# Patient Record
Sex: Female | Born: 1990 | Race: Black or African American | Hispanic: No | Marital: Single | Smoking: Never smoker
Health system: Southern US, Community
[De-identification: ages and names within clinical notes are randomized; demographics above are authoritative.]

## PROBLEM LIST (undated history)

## (undated) HISTORY — PX: NO PAST SURGERIES: SHX2092

---
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
## 2010-07-04 LAB — CBC
HCT: 29.4 % — ABNORMAL LOW (ref 36.0–46.0)
Hemoglobin: 10.2 g/dL — ABNORMAL LOW (ref 12.0–15.0)
MCHC: 32.4 g/dL (ref 30.0–36.0)
MCHC: 33.1 g/dL (ref 30.0–36.0)
MCV: 80.7 fL (ref 78.0–100.0)
MCV: 80.9 fL (ref 78.0–100.0)
Platelets: 182 10*3/uL (ref 150–400)
RBC: 3.89 MIL/uL (ref 3.87–5.11)
RDW: 14.6 % (ref 11.5–15.5)
RDW: 14.6 % (ref 11.5–15.5)
WBC: 14.9 10*3/uL — ABNORMAL HIGH (ref 4.0–10.5)

## 2010-12-23 LAB — URINALYSIS, ROUTINE W REFLEX MICROSCOPIC
Bilirubin Urine: NEGATIVE
Ketones, ur: 40 mg/dL — AB
Nitrite: NEGATIVE
Protein, ur: NEGATIVE mg/dL
Urobilinogen, UA: 4 mg/dL — ABNORMAL HIGH (ref 0.0–1.0)
pH: 6.5 (ref 5.0–8.0)

## 2013-05-14 ENCOUNTER — Encounter (HOSPITAL_COMMUNITY): Payer: Self-pay | Admitting: Emergency Medicine

## 2013-05-14 ENCOUNTER — Emergency Department (HOSPITAL_COMMUNITY)
Admission: EM | Admit: 2013-05-14 | Discharge: 2013-05-14 | Disposition: A | Discharge status: Home / Self Care | Payer: Self-pay | Attending: Emergency Medicine | Admitting: Emergency Medicine

## 2013-05-14 DIAGNOSIS — Z79899 Other long term (current) drug therapy: Secondary | ICD-10-CM | POA: Insufficient documentation

## 2013-05-14 DIAGNOSIS — R51 Headache: Secondary | ICD-10-CM | POA: Insufficient documentation

## 2013-05-14 DIAGNOSIS — J020 Streptococcal pharyngitis: Secondary | ICD-10-CM | POA: Insufficient documentation

## 2013-05-14 DIAGNOSIS — R61 Generalized hyperhidrosis: Secondary | ICD-10-CM | POA: Insufficient documentation

## 2013-05-14 MED ORDER — ACETAMINOPHEN 325 MG PO TABS
325.0000 mg | ORAL_TABLET | Freq: Once | ORAL | Status: AC
  Administered 2013-05-14: 325 mg via ORAL
  Filled 2013-05-14: qty 1

## 2013-05-14 MED ORDER — ACETAMINOPHEN 325 MG PO TABS
650.0000 mg | ORAL_TABLET | Freq: Once | ORAL | Status: AC
  Administered 2013-05-14: 650 mg via ORAL
  Filled 2013-05-14: qty 2

## 2013-05-14 MED ORDER — SODIUM CHLORIDE 0.9 % IV BOLUS (SEPSIS): 1000.0000 mL | Freq: Once | INTRAVENOUS | Status: DC

## 2013-05-14 MED ORDER — AMOXICILLIN 500 MG PO CAPS: 500.0000 mg | ORAL_CAPSULE | Freq: Three times a day (TID) | ORAL | Status: DC

## 2013-05-14 MED ORDER — ACETAMINOPHEN 325 MG PO TABS
650.0000 mg | ORAL_TABLET | Freq: Once | ORAL | Status: DC
  Filled 2013-05-14: qty 2

## 2013-05-14 MED ORDER — AMOXICILLIN 500 MG PO CAPS
500.0000 mg | ORAL_CAPSULE | Freq: Once | ORAL | Status: AC
  Administered 2013-05-14: 500 mg via ORAL
  Filled 2013-05-14: qty 1

## 2013-05-14 NOTE — ED Notes (Signed)
RN dropped tylenol on floor, new order placed. [facility]

## 2013-05-14 NOTE — ED Notes (Signed)
Patient presents with c/o generalized body aches, nausea for the past 2 days.  Has been taking Tylenol and Nyquil

## 2013-05-14 NOTE — Discharge Instructions (Signed)
Please continue the antibiotics for possible strep throat as instructed. Drink plenty of fluids to stay hydrated. Take Tylenol and/or ibuprofen for pain and fever. Followup with a primary care provider.     Strep Throat Strep throat is an infection of the throat caused by a bacteria named Streptococcus pyogenes. Your caregiver may call the infection streptococcal "tonsillitis" or "pharyngitis" depending on whether there are signs of inflammation in the tonsils or back of the throat. Strep throat is most common in children aged 5 15 years during the cold months of the year, but it can occur in people of any age during any season. This infection is spread from person to person (contagious) through coughing, sneezing, or other close contact. SYMPTOMS   Fever or chills.  Painful, swollen, red tonsils or throat.  Pain or difficulty when swallowing.  White or yellow spots on the tonsils or throat.  Swollen, tender lymph nodes or "glands" of the neck or under the jaw.  Red rash all over the body (rare). DIAGNOSIS  Many different infections can cause the same symptoms. A test must be done to confirm the diagnosis so the right treatment can be given. A "rapid strep test" can help your caregiver make the diagnosis in a few minutes. If this test is not available, a light swab of the infected area can be used for a throat culture test. If a throat culture test is done, results are usually available in a day or two. TREATMENT  Strep throat is treated with antibiotic medicine. HOME CARE INSTRUCTIONS   Gargle with 1 tsp of salt in 1 cup of warm water, 3 4 times per day or as needed for comfort.  Family members who also have a sore throat or fever should be tested for strep throat and treated with antibiotics if they have the strep infection.  Make sure everyone in your household washes their hands well.  Do not share food, drinking cups, or personal items that could cause the infection to spread to  others.  You may need to eat a soft food diet until your sore throat gets better.  Drink enough water and fluids to keep your urine clear or pale yellow. This will help prevent dehydration.  Get plenty of rest.  Stay home from school, daycare, or work until you have been on antibiotics for 24 hours.  Only take over-the-counter or prescription medicines for pain, discomfort, or fever as directed by your caregiver.  If antibiotics are prescribed, take them as directed. Finish them even if you start to feel better. SEEK MEDICAL CARE IF:   The glands in your neck continue to enlarge.  You develop a rash, cough, or earache.  You cough up green, yellow-brown, or bloody sputum.  You have pain or discomfort not controlled by medicines.  Your problems seem to be getting worse rather than better. SEEK IMMEDIATE MEDICAL CARE IF:   You develop any new symptoms such as vomiting, severe headache, stiff or painful neck, chest pain, shortness of breath, or trouble swallowing.  You develop severe throat pain, drooling, or changes in your voice.  You develop swelling of the neck, or the skin on the neck becomes red and tender.  You have a fever.  You develop signs of dehydration, such as fatigue, dry mouth, and decreased urination.  You become increasingly sleepy, or you cannot wake up completely. Document Released: 03/03/2000 Document Revised: 02/21/2012 Document Reviewed: 05/05/2010 [facility] Patient Information 2014 [place], [place].

## 2013-05-14 NOTE — ED Provider Notes (Signed)
CSN: [id number]     Arrival date & time 05/14/13  1846 History   First MD Initiated Contact with Patient 05/14/13 2019     Chief Complaint  Patient presents with  . Generalized Body Aches   HPI  History provided by the patient. Patient is a 23 year old female with no significant PMH who presents with complaints of generalized bodyaches and began on Monday. She reports having a sore throat the same day which has been persistent. She has been taking some Tylenol and NyQuil for symptoms without any significant changes. She reports pain anytime she eats and drinks but has been drinking fluids. Denies any other associated symptoms. Denies any vomiting, diarrhea, cough or congestion. Denies any known sick contacts. No recent travel.    History reviewed. No pertinent past medical history. History reviewed. No pertinent past surgical history. History reviewed. No pertinent family history. History  Substance Use Topics  . Smoking status: Never Smoker   . Smokeless tobacco: Never Used  . Alcohol Use: Yes     Comment: ocassionally   OB History   Grav Para Term Preterm Abortions TAB SAB Ect Mult Living                 Review of Systems  Constitutional: Positive for fever, chills and diaphoresis.  HENT: Positive for sore throat. Negative for congestion and rhinorrhea.   Respiratory: Negative for cough.   Gastrointestinal: Negative for nausea, vomiting, abdominal pain and diarrhea.  Genitourinary: Negative for dysuria, frequency, hematuria and flank pain.  Musculoskeletal: Positive for myalgias.  Neurological: Positive for headaches.  All other systems reviewed and are negative.      Allergies  Review of patient's allergies indicates no known allergies.  Home Medications   Current Outpatient Rx  Name  Route  Sig  Dispense  Refill  . acetaminophen (TYLENOL) 500 MG tablet   Oral   Take 1,000 mg by mouth every 8 (eight) hours as needed for moderate pain.         Marland Kitchen. ibuprofen  (ADVIL,MOTRIN) 200 MG tablet   Oral   Take 400 mg by mouth daily as needed for mild pain.          BP 107/69  Pulse 94  Temp(Src) 100.9 F (38.3 C) (Oral)  Resp 18  SpO2 99% Physical Exam  Nursing note and vitals reviewed. Constitutional: She is oriented to person, place, and time. She appears well-developed and well-nourished. No distress.  HENT:  Head: Normocephalic.  Right Ear: Tympanic membrane normal.  Left Ear: Tympanic membrane normal.  Pharynx is erythematous with mild enlargement of the tonsils bilaterally. There is exudate bilaterally. Uvula midline. No central PTA. No trismus.  Neck: Normal range of motion. Neck supple.  No meningeal signs  Cardiovascular: Normal rate and regular rhythm.   Pulmonary/Chest: Effort normal and breath sounds normal. No respiratory distress. She has no wheezes. She has no rales.  Abdominal: Soft.  Musculoskeletal: Normal range of motion.  Lymphadenopathy:    She has cervical adenopathy.  Neurological: She is alert and oriented to person, place, and time.  Skin: Skin is warm and dry. No rash noted.  Psychiatric: She has a normal mood and affect. Her behavior is normal.    ED Course  Procedures   DIAGNOSTIC STUDIES: Oxygen Saturation is 100% on room air.    COORDINATION OF CARE:  Nursing notes reviewed. Vital signs reviewed. Initial pt interview and examination performed.   9:26 PM-patient seen and evaluated. She appears well. She does  not appear severely ill or toxic.  Patient does have significantly erythematous pharynx and tonsil area with exudate. No signs for peritonsillar abscess. Symptoms to suggest strep throat or possibly viral infection. Discussed treatment options with patient and at this time we'll give prescription for amoxicillin for possible strep throat. She was instructed on symptomatic treatment for her sore throat and fevers. He agrees with plan.   Treatment plan initiated: Medications  acetaminophen (TYLENOL)  tablet 650 mg (650 mg Oral Given 05/14/13 2039)  acetaminophen (TYLENOL) tablet 325 mg (325 mg Oral Given 05/14/13 2048)  amoxicillin (AMOXIL) capsule 500 mg (500 mg Oral Given 05/14/13 2123)      MDM   Final diagnoses:  Strep throat        Angus Seller, PA-C 05/14/13 2128

## 2013-05-15 NOTE — ED Provider Notes (Signed)
Medical screening examination/treatment/procedure(s) were performed by non-physician practitioner and as supervising physician I was immediately available for consultation/collaboration.  EKG Interpretation   None        Arnitra Sokoloski, MD 05/15/13 1617 

## 2015-03-19 ENCOUNTER — Encounter (HOSPITAL_COMMUNITY): Payer: Self-pay | Admitting: *Deleted

## 2015-03-19 ENCOUNTER — Emergency Department (HOSPITAL_COMMUNITY)
Admission: EM | Admit: 2015-03-19 | Discharge: 2015-03-19 | Disposition: A | Discharge status: Home / Self Care | Payer: Self-pay | Attending: Emergency Medicine | Admitting: Emergency Medicine

## 2015-03-19 DIAGNOSIS — R69 Illness, unspecified: Secondary | ICD-10-CM

## 2015-03-19 DIAGNOSIS — J111 Influenza due to unidentified influenza virus with other respiratory manifestations: Secondary | ICD-10-CM | POA: Insufficient documentation

## 2015-03-19 DIAGNOSIS — R1110 Vomiting, unspecified: Secondary | ICD-10-CM | POA: Insufficient documentation

## 2015-03-19 DIAGNOSIS — Z792 Long term (current) use of antibiotics: Secondary | ICD-10-CM | POA: Insufficient documentation

## 2015-03-19 DIAGNOSIS — H9209 Otalgia, unspecified ear: Secondary | ICD-10-CM | POA: Insufficient documentation

## 2015-03-19 MED ORDER — IBUPROFEN 800 MG PO TABS: 800.0000 mg | ORAL_TABLET | Freq: Three times a day (TID) | ORAL | Status: DC | PRN

## 2015-03-19 MED ORDER — HYDROCODONE-HOMATROPINE 5-1.5 MG/5ML PO SYRP: 5.0000 mL | ORAL_SOLUTION | Freq: Four times a day (QID) | ORAL | Status: DC | PRN

## 2015-03-19 NOTE — ED Notes (Addendum)
Pt reports flu like symptoms x 3 days. Has chills, headache, face pain, productive cough, bodyaches and lack of appetite. Mask on pt at triage.

## 2015-03-19 NOTE — ED Provider Notes (Signed)
CSN: [id number]     Arrival date & time 03/19/15  1148 History  By signing my name below, I, Ronney Lion, attest that this documentation has been prepared under the direction and in the presence of [facility], PA-C. Electronically Signed: Ronney Lion, ED Scribe. 03/19/2015. 1:45 PM.    Chief Complaint  Patient presents with  . Influenza   The history is provided by the patient. No language interpreter was used.   HPI Comments: Beverly Chapman is a 24 y.o. female with no pertinent PMHx, who presents to the Emergency Department with multiple complaints, including gradual-onset, constant, moderate, generalized myalgias, that began 4 days ago. Associated symptoms include chills, a cough that is occasionally productive, one episode of post-tussive vomiting, bilateral otalgia, sore throat, or rhinorrhea. She states she is unsure whether she had a fever, as she did not take her temperature. Patient had taken Tylenol yesterday with moderate but transient relief. Patient states she did not get a flu vaccine this year. She denies any known sick contact. She denies a history of asthma or smoking. She denies hemoptysis, abdominal pain, diarrhea, or dysuria.    History reviewed. No pertinent past medical history. History reviewed. No pertinent past surgical history. History reviewed. No pertinent family history. Social History  Substance Use Topics  . Smoking status: Never Smoker   . Smokeless tobacco: Never Used  . Alcohol Use: Yes     Comment: ocassionally   OB History    No data available     Review of Systems  Constitutional: Positive for chills and appetite change.  HENT: Positive for ear pain, rhinorrhea and sore throat.   Respiratory: Positive for cough.   Gastrointestinal: Positive for vomiting. Negative for abdominal pain and diarrhea.  Genitourinary: Negative for dysuria.  Musculoskeletal: Positive for myalgias (generalized).  Neurological: Positive for headaches.  All other systems  reviewed and are negative.  Allergies  Review of patient's allergies indicates no known allergies.  Home Medications   Prior to Admission medications   Medication Sig Start Date End Date Taking? Authorizing Provider  acetaminophen (TYLENOL) 500 MG tablet Take 1,000 mg by mouth every 8 (eight) hours as needed for moderate pain.    Historical Provider, MD  amoxicillin (AMOXIL) 500 MG capsule Take 1 capsule (500 mg total) by mouth 3 (three) times daily. 05/14/13   Ivonne Andrew, PA-C  ibuprofen (ADVIL,MOTRIN) 200 MG tablet Take 400 mg by mouth daily as needed for mild pain.    Historical Provider, MD   BP 119/87 mmHg  Pulse 100  Temp(Src) 99.7 F (37.6 C) (Oral)  Resp 18  SpO2 96%  LMP 03/12/2015 Physical Exam  Constitutional: She appears well-developed and well-nourished. No distress.  HENT:  Head: Normocephalic and atraumatic.  Mouth/Throat: Oropharynx is clear and moist. No oropharyngeal exudate.  Eyes: Conjunctivae are normal.  Neck: Neck supple.  Cardiovascular: Normal rate and regular rhythm.   Pulmonary/Chest: Effort normal and breath sounds normal. No respiratory distress. She has no wheezes. She has no rales.  Lungs are clear to auscultation.   Neurological: She is alert.  Skin: She is not diaphoretic.  Nursing note and vitals reviewed.   ED Course  Procedures (including critical care time)  DIAGNOSTIC STUDIES: Oxygen Saturation is 96% on RA, normal by my interpretation.    COORDINATION OF CARE: 1:36 PM - Patients symptoms are consistent with URI, likely viral etiology. Discussed that antibiotics are not indicated for viral infections. Pt will be discharged with symptomatic treatment, including ibuprofen and  Hycodan. Pt verbalizes understanding and is agreeable with plan.  MDM   Final diagnoses:  Influenza-like illness   Afebrile, nontoxic patient with constellation of symptoms suggestive of viral syndrome, possible influenza.  No concerning findings on exam.   Discharged home with supportive care, PCP follow up.  Discussed result, findings, treatment, and follow up  with patient.  Pt given return precautions.  Pt verbalizes understanding and agrees with plan.      I doubt any other EMC precluding discharge at this time including, but not necessarily limited to the following: PE, PNA  I personally performed the services described in this documentation, which was scribed in my presence. The recorded information has been reviewed and is accurate.     Trixie Dredgemily Jarrick Fjeld, PA-C 03/19/15 1948  Glynn OctaveStephen Rancour, MD 03/19/15 [phone]

## 2015-03-19 NOTE — Discharge Instructions (Signed)
Read the information below.  Use the prescribed medication as directed.  Please discuss all new medications with your pharmacist.  You may return to the Emergency Department at any time for worsening condition or any new symptoms that concern you.  If you develop high fevers that do not resolve with tylenol or ibuprofen, you have difficulty swallowing or breathing, or you are unable to tolerate fluids by mouth, return to the ER for a recheck.      Influenza, Adult Influenza (flu) is an infection in the mouth, nose, and throat (respiratory tract) caused by a virus. The flu can make you feel very ill. Influenza spreads easily from person to person (contagious).  HOME CARE   Only take medicines as told by your doctor.  Use a cool mist humidifier to make breathing easier.  Get plenty of rest until your fever goes away. This usually takes 3 to 4 days.  Drink enough fluids to keep your pee (urine) clear or pale yellow.  Cover your mouth and nose when you cough or sneeze.  Wash your hands well to avoid spreading the flu.  Stay home from work or school until your fever has been gone for at least 1 full day.  Get a flu shot every year. GET HELP RIGHT AWAY IF:   You have trouble breathing or feel short of breath.  Your skin or nails turn blue.  You have severe neck pain or stiffness.  You have a severe headache, facial pain, or earache.  Your fever gets worse or keeps coming back.  You feel sick to your stomach (nauseous), throw up (vomit), or have watery poop (diarrhea).  You have chest pain.  You have a deep cough that gets worse, or you cough up more thick spit (mucus). MAKE SURE YOU:   Understand these instructions.  Will watch your condition.  Will get help right away if you are not doing well or get worse.   This information is not intended to replace advice given to you by your health care provider. Make sure you discuss any questions you have with your health care  provider.   Document Released: 12/14/2007 Document Revised: 03/27/2014 Document Reviewed: 06/05/2011 Elsevier Interactive Patient Education 2016 Elsevier Inc.  Viral Infections A virus is a type of germ. Viruses can cause:  Minor sore throats.  Aches and pains.  Headaches.  Runny nose.  Rashes.  Watery eyes.  Tiredness.  Coughs.  Loss of appetite.  Feeling sick to your stomach (nausea).  Throwing up (vomiting).  Watery poop (diarrhea). HOME CARE   Only take medicines as told by your doctor.  Drink enough water and fluids to keep your pee (urine) clear or pale yellow. Sports drinks are a good choice.  Get plenty of rest and eat healthy. Soups and broths with crackers or rice are fine. GET HELP RIGHT AWAY IF:   You have a very bad headache.  You have shortness of breath.  You have chest pain or neck pain.  You have an unusual rash.  You cannot stop throwing up.  You have watery poop that does not stop.  You cannot keep fluids down.  You or your child has a temperature by mouth above 102 F (38.9 C), not controlled by medicine.  Your baby is older than 3 months with a rectal temperature of 102 F (38.9 C) or higher.  Your baby is 18 months old or younger with a rectal temperature of 100.4 F (38 C) or higher. MAKE  SURE YOU:   Understand these instructions.  Will watch this condition.  Will get help right away if you are not doing well or get worse.   This information is not intended to replace advice given to you by your health care provider. Make sure you discuss any questions you have with your health care provider.   Document Released: 02/17/2008 Document Revised: 05/29/2011 Document Reviewed: 08/12/2014 Elsevier Interactive Patient Education [employer] Elsevier Inc.

## 2015-05-28 LAB — CYTOLOGY - PAP: Pap: NEGATIVE

## 2016-06-13 ENCOUNTER — Encounter (HOSPITAL_COMMUNITY): Payer: Self-pay

## 2016-06-13 ENCOUNTER — Emergency Department (HOSPITAL_COMMUNITY)
Admission: EM | Admit: 2016-06-13 | Discharge: 2016-06-13 | Disposition: A | Discharge status: Home / Self Care | Payer: Self-pay | Attending: Emergency Medicine | Admitting: Emergency Medicine

## 2016-06-13 DIAGNOSIS — R5383 Other fatigue: Secondary | ICD-10-CM | POA: Insufficient documentation

## 2016-06-13 DIAGNOSIS — R52 Pain, unspecified: Secondary | ICD-10-CM

## 2016-06-13 DIAGNOSIS — J020 Streptococcal pharyngitis: Secondary | ICD-10-CM | POA: Insufficient documentation

## 2016-06-13 LAB — RAPID STREP SCREEN (MED CTR MEBANE ONLY): STREPTOCOCCUS, GROUP A SCREEN (DIRECT): NEGATIVE

## 2016-06-13 MED ORDER — ACETAMINOPHEN 325 MG PO TABS
650.0000 mg | ORAL_TABLET | Freq: Once | ORAL | Status: AC | PRN
  Administered 2016-06-13: 650 mg via ORAL

## 2016-06-13 MED ORDER — ACETAMINOPHEN 325 MG PO TABS
ORAL_TABLET | ORAL | Status: AC
  Filled 2016-06-13: qty 2

## 2016-06-13 MED ORDER — AMOXICILLIN 500 MG PO CAPS: 500.0000 mg | ORAL_CAPSULE | Freq: Three times a day (TID) | ORAL | 0 refills | Status: AC

## 2016-06-13 NOTE — ED Provider Notes (Signed)
[facility]EMERGENCY DEPT Provider Note   CSN: [id number] Arrival date & time: 06/13/16  0848   By signing my name below, I, Bobbie Stack, attest that this documentation has been prepared under the direction and in the presence of Michela Pitcher, [place]. Electronically Signed: Bobbie Stack, Scribe. 06/13/16. 9:34 AM. History   Chief Complaint Chief Complaint  Patient presents with  . Sore Throat  . Generalized Body Aches    The history is provided by the patient. No language interpreter was used.  HPI Comments: Beverly Chapman is a 26 y.o. female who presents to the Emergency Department complaining of a gradually worsening sore throat for the past 2 days. She rates her pain 8/10. She also reports generalized body aches, right ear pain, subjective fevers, chills, difficulty swallowing, and SOB (which she thinks is due to throat pain). She states that she has taken tylenol with no relief. She states that she has had similar problems in the past and tested positive for strep. She denies nausea, vomiting, abodminal pain, CP, LOC, cough, and sneezing. She states that she has not been eating much recently but she has been drinking normally.    History reviewed. No pertinent past medical history.  There are no active problems to display for this patient.   History reviewed. No pertinent surgical history.  OB History    No data available       Home Medications    Prior to Admission medications   Medication Sig Start Date End Date Taking? Authorizing Provider  acetaminophen (TYLENOL) 500 MG tablet Take 1,000 mg by mouth every 8 (eight) hours as needed for moderate pain.    Historical Provider, MD  amoxicillin (AMOXIL) 500 MG capsule Take 1 capsule (500 mg total) by mouth 3 (three) times daily. 06/13/16 06/23/16  Chyan Carnero A Maeli Spacek, PA  HYDROcodone-homatropine (HYCODAN) 5-1.5 MG/5ML syrup Take 5 mLs by mouth every 6 (six) hours as needed for cough. 03/19/15   Trixie Dredge, PA-C  ibuprofen  (ADVIL,MOTRIN) 800 MG tablet Take 1 tablet (800 mg total) by mouth every 8 (eight) hours as needed for mild pain or moderate pain. 03/19/15   Trixie Dredge, PA-C    Family History History reviewed. No pertinent family history.  Social History Social History  Substance Use Topics  . Smoking status: Never Smoker  . Smokeless tobacco: Never Used  . Alcohol use Yes     Comment: ocassionally     Allergies   Patient has no known allergies.   Review of Systems Review of Systems  Constitutional: Positive for chills and fever.  HENT: Positive for ear pain (Right ear), sore throat and trouble swallowing. Negative for congestion and sneezing.   Respiratory: Positive for shortness of breath. Negative for cough.   Gastrointestinal: Negative for abdominal pain, nausea and vomiting.  Musculoskeletal: Positive for myalgias.     Physical Exam Updated Vital Signs BP 126/82 (BP Location: Left Arm)   Pulse (!) 103   Temp (!) 103.3 F (39.6 C) (Oral)   Resp 16   LMP 05/23/2016 (Within Days)   SpO2 100%   Physical Exam  Constitutional: She is oriented to person, place, and time. She appears well-developed and well-nourished. No distress.  HENT:  Head: Normocephalic and atraumatic.  Right Ear: External ear normal.  Left Ear: External ear normal.  Nose: Nose normal.  Erythematous pharynx, enlarged tonsils with white exudates, no uvular deviation, malodorous breath, dentition normal, TMs not erythematous or bulging bilaterally  Eyes: Conjunctivae and EOM are normal. Right  eye exhibits no discharge. Left eye exhibits no discharge. No scleral icterus.  Neck: Normal range of motion. Neck supple. No JVD present. No tracheal deviation present. No thyromegaly present.  Anterior cervical LAD on the b/l  Cardiovascular: Regular rhythm, normal heart sounds and intact distal pulses.   Mildly tachycardic  Pulmonary/Chest: Effort normal and breath sounds normal.  Abdominal: Soft. She exhibits no  distension. There is no tenderness.  Musculoskeletal: Normal range of motion.  Lymphadenopathy:    She has cervical adenopathy.  Neurological: She is alert and oriented to person, place, and time.  Skin: Skin is warm and dry. Capillary refill takes less than 2 seconds. No rash noted. She is not diaphoretic. No erythema.  Psychiatric: She has a normal mood and affect. Her behavior is normal. Judgment and thought content normal.     ED Treatments / Results  DIAGNOSTIC STUDIES: Oxygen Saturation is 100% on RA, normal by my interpretation.    COORDINATION OF CARE: 9:26 AM Discussed treatment plan with pt at bedside and pt agreed to plan. I will check the patient's Rapid Strep screen.  Labs (all labs ordered are listed, but only abnormal results are displayed) Labs Reviewed  RAPID STREP [facility])  CULTURE, GROUP A STREP [facility])    EKG  EKG Interpretation None       Radiology No results found.  Procedures Procedures (including critical care time)  Medications Ordered in ED Medications  acetaminophen (TYLENOL) tablet 650 mg (650 mg Oral Given 06/13/16 0914)     Initial Impression / Assessment and Plan / ED Course  I have reviewed the triage vital signs and the nursing notes.  Pertinent labs & imaging results that were available during my care of the patient were reviewed by me and considered in my medical decision making (see chart for details).      25yof presents with chief complaint 2 days of sore throat and generalized body aches. Febrile at 103F, mildly tachycardic. Given tylenol for pain and fever. TMs normal b/l. Pharynx erythematous with tonsillar swelling and white exudates; anterior cervical LAD, no cough. No uvular deviation and airway patent, low suspicion of peritonsillar abscess or retropharyngeal abscess. Rapid strep negative, but physical exam concerning for strep pharyngitis given high fever and tonsillar exudates, lack of cough. On re-examination  pt found to be resting comfortably in chair, fever reduced to 100.50F and HR wnl. Will treat for strep empirically with amoxicillin, also discussed supportive care. Pt will follow up with PCP within 1 week if symptoms do not improve. Discussed strict ED return precautions. Pt understands and is agreeable to this plan.   Final Clinical Impressions(s) / ED Diagnoses   Final diagnoses:  Strep pharyngitis  Body aches    New Prescriptions New Prescriptions   AMOXICILLIN (AMOXIL) 500 MG CAPSULE    Take 1 capsule (500 mg total) by mouth 3 (three) times daily.   I personally performed the services described in this documentation, which was scribed in my presence. The recorded information has been reviewed and is accurate.     Jeanie SewerMina A Walton Digilio, [place] 06/13/16 1049    Alvira MondayErin Schlossman, MD 06/17/16 1836

## 2016-06-13 NOTE — ED Triage Notes (Signed)
Pt states sore throat that began Sunday. White spots noted to tonsils. Pt has temp of 103.1 here. Pt states she has been using tylenol at home, last taken last night. Airway patent at this time.

## 2016-06-13 NOTE — Discharge Instructions (Signed)
Salt water gargles, tea, honey, ibuprofen for fever/pain as needed. If you develop tightness in your throat and have difficulty breathing or keeping food/drink down, return to the ED. Complete the antibiotic course in its entirety.

## 2016-06-15 LAB — CULTURE, GROUP A STREP (THRC)

## 2016-06-16 ENCOUNTER — Telehealth: Payer: Self-pay

## 2016-06-16 NOTE — Telephone Encounter (Signed)
Post ED Visit - Positive Culture Follow-up  Culture report reviewed by antimicrobial stewardship pharmacist:   Enzo Bi, Pharm.D.  Celedonio Miyamoto, Pharm.D., BCPS AQ-ID  Garvin Fila, Pharm.D., BCPS  Georgina Pillion, [address].D., BCPS  [place], [address].D., BCPS, AAHIVP  Estella Husk, Pharm.D., BCPS, AAHIVP  Lysle Pearl, PharmD, BCPS  Casilda Carls, PharmD, BCPS  Pollyann Samples, PharmD, BCPS Shirlee Limerick Pharm D Positive urine culture Treated with Amoxicillin, organism sensitive to the same and no further patient follow-up is required at this time.  Jerry Caras 06/16/2016, 10:51 AM

## 2017-08-28 ENCOUNTER — Encounter (HOSPITAL_COMMUNITY): Payer: Self-pay

## 2017-08-28 ENCOUNTER — Emergency Department (HOSPITAL_COMMUNITY): Payer: Self-pay

## 2017-08-28 ENCOUNTER — Other Ambulatory Visit: Payer: Self-pay

## 2017-08-28 ENCOUNTER — Emergency Department (HOSPITAL_COMMUNITY)
Admission: EM | Admit: 2017-08-28 | Discharge: 2017-08-28 | Disposition: A | Discharge status: Home / Self Care | Payer: Self-pay | Attending: Emergency Medicine | Admitting: Emergency Medicine

## 2017-08-28 DIAGNOSIS — O26899 Other specified pregnancy related conditions, unspecified trimester: Secondary | ICD-10-CM

## 2017-08-28 DIAGNOSIS — O9989 Other specified diseases and conditions complicating pregnancy, childbirth and the puerperium: Secondary | ICD-10-CM | POA: Insufficient documentation

## 2017-08-28 DIAGNOSIS — R102 Pelvic and perineal pain: Secondary | ICD-10-CM | POA: Insufficient documentation

## 2017-08-28 DIAGNOSIS — Z3A10 10 weeks gestation of pregnancy: Secondary | ICD-10-CM | POA: Insufficient documentation

## 2017-08-28 LAB — COMPREHENSIVE METABOLIC PANEL
ALBUMIN: 3.4 g/dL — AB (ref 3.5–5.0)
ALT: 11 U/L — ABNORMAL LOW (ref 14–54)
ANION GAP: 8 (ref 5–15)
AST: 17 U/L (ref 15–41)
Alkaline Phosphatase: 46 U/L (ref 38–126)
BUN: <5 mg/dL — ABNORMAL LOW (ref 6–20)
CO2: 25 mmol/L (ref 22–32)
Calcium: 9.4 mg/dL (ref 8.9–10.3)
Chloride: 103 mmol/L (ref 101–111)
Creatinine, Ser: 0.58 mg/dL (ref 0.44–1.00)
GFR calc Af Amer: >60 mL/min (ref >60)
GLUCOSE: 79 mg/dL (ref 65–99)
POTASSIUM: 4 mmol/L (ref 3.5–5.1)
Sodium: 136 mmol/L (ref 135–145)
Total Bilirubin: 0.6 mg/dL (ref 0.3–1.2)
Total Protein: 7.9 g/dL (ref 6.5–8.1)

## 2017-08-28 LAB — OB RESULTS CONSOLE HEPATITIS B SURFACE ANTIGEN: Hepatitis B Surface Ag: NEGATIVE

## 2017-08-28 LAB — CBC WITH DIFFERENTIAL/PLATELET
ABS IMMATURE GRANULOCYTES: 0 10*3/uL (ref 0.0–0.1)
Basophils Absolute: 0 10*3/uL (ref 0.0–0.1)
Basophils Relative: 0 %
Eosinophils Absolute: 0.2 10*3/uL (ref 0.0–0.7)
Eosinophils Relative: 2 %
HCT: 41.7 % (ref 36.0–46.0)
HEMOGLOBIN: 13.4 g/dL (ref 12.0–15.0)
IMMATURE GRANULOCYTES: 0 %
LYMPHS PCT: 22 %
Lymphs Abs: 2 10*3/uL (ref 0.7–4.0)
MCH: 26.1 pg (ref 26.0–34.0)
MCHC: 32.1 g/dL (ref 30.0–36.0)
MCV: 81.3 fL (ref 78.0–100.0)
Monocytes Absolute: 0.7 10*3/uL (ref 0.1–1.0)
Monocytes Relative: 8 %
NEUTROS ABS: 6.1 10*3/uL (ref 1.7–7.7)
Neutrophils Relative %: 68 %
Platelets: 267 10*3/uL (ref 150–400)
RBC: 5.13 MIL/uL — AB (ref 3.87–5.11)
RDW: 13.9 % (ref 11.5–15.5)
WBC: 9 10*3/uL (ref 4.0–10.5)

## 2017-08-28 LAB — WET PREP, GENITAL
Sperm: NONE SEEN
Trich, Wet Prep: NONE SEEN
Yeast Wet Prep HPF POC: NONE SEEN

## 2017-08-28 LAB — OB RESULTS CONSOLE ANTIBODY SCREEN: Antibody Screen: NEGATIVE

## 2017-08-28 LAB — OB RESULTS CONSOLE VARICELLA ZOSTER ANTIBODY, IGG: VARICELLA IGG: IMMUNE

## 2017-08-28 LAB — HCG, QUANTITATIVE, PREGNANCY: hCG, Beta Chain, Quant, S: 130146 m[IU]/mL — ABNORMAL HIGH (ref <5)

## 2017-08-28 LAB — ABO/RH: ABO/RH(D): A POS

## 2017-08-28 LAB — URINALYSIS, ROUTINE W REFLEX MICROSCOPIC
BILIRUBIN URINE: NEGATIVE
Glucose, UA: NEGATIVE mg/dL
Hgb urine dipstick: NEGATIVE
KETONES UR: 80 mg/dL — AB
Leukocytes, UA: NEGATIVE
NITRITE: NEGATIVE
Protein, ur: NEGATIVE mg/dL
Specific Gravity, Urine: 1.025 (ref 1.005–1.030)
pH: 5 (ref 5.0–8.0)

## 2017-08-28 LAB — OB RESULTS CONSOLE ABO/RH: RH TYPE: POSITIVE

## 2017-08-28 LAB — OB RESULTS CONSOLE RPR: RPR: NONREACTIVE

## 2017-08-28 LAB — OB RESULTS CONSOLE RUBELLA ANTIBODY, IGM: RUBELLA: IMMUNE

## 2017-08-28 LAB — OB RESULTS CONSOLE GC/CHLAMYDIA: Gonorrhea: NEGATIVE

## 2017-08-28 LAB — CYSTIC FIBROSIS DIAGNOSTIC STUDY: INTERPRETATION-CFDNA: NEGATIVE

## 2017-08-28 LAB — OB RESULTS CONSOLE HIV ANTIBODY (ROUTINE TESTING): HIV: NONREACTIVE

## 2017-08-28 MED ORDER — PRENATAL COMPLETE 14-0.4 MG PO TABS: 1.0000 | ORAL_TABLET | Freq: Every day | ORAL | 0 refills | Status: AC

## 2017-08-28 NOTE — ED Provider Notes (Signed)
[facility] Provider Note   CSN: [id number] Arrival date & time: 08/28/17  1312     History   Chief Complaint Chief Complaint  Patient presents with  . Pelvic Pain    HPI Beverly Chapman is a 27 y.o. female.  HPI   27 year old female presents today with complaints of pelvic pain vaginal spotting and cramping x4 days.  Patient notes she is pregnant with LMP on 06/29/2017.  Patient has not had ultrasound.  She denies any vaginal bleeding presently, denies any vaginal discharge.  Patient notes the pain is crampy coming and going nonsevere nonlocalized, no upper abdominal pain fever chills nausea or vomiting.    History reviewed. No pertinent past medical history.  There are no active problems to display for this patient.   History reviewed. No pertinent surgical history.   OB History    Gravida  1   Para      Term      Preterm      AB      Living        SAB      TAB      Ectopic      Multiple      Live Births               Home Medications    Prior to Admission medications   Medication Sig Start Date End Date Taking? Authorizing Provider  HYDROcodone-homatropine (HYCODAN) 5-1.5 MG/5ML syrup Take 5 mLs by mouth every 6 (six) hours as needed for cough. Patient not taking: Reported on 08/28/2017 03/19/15   Trixie DredgeWest, Emily, PA-C  ibuprofen (ADVIL,MOTRIN) 800 MG tablet Take 1 tablet (800 mg total) by mouth every 8 (eight) hours as needed for mild pain or moderate pain. Patient not taking: Reported on 08/28/2017 03/19/15   Trixie DredgeWest, Emily, PA-C  Prenatal Vit-Fe Fumarate-FA (PRENATAL COMPLETE) 14-0.4 MG TABS Take 1 tablet by mouth daily. 08/28/17   Eyvonne MechanicHedges, Swati Granberry, PA-C    Family History History reviewed. No pertinent family history.  Social History Social History   Tobacco Use  . Smoking status: Never Smoker  . Smokeless tobacco: Never Used  Substance Use Topics  . Alcohol use: Yes    Comment: ocassionally  . Drug  use: No     Allergies   Patient has no known allergies.   Review of Systems Review of Systems  All other systems reviewed and are negative.    Physical Exam Updated Vital Signs BP 116/77 (BP Location: Right Arm)   Pulse 64   Temp 98.1 F (36.7 C) (Oral)   Resp 15   LMP 06/29/2017   SpO2 100%   Physical Exam  Constitutional: She is oriented to person, place, and time. She appears well-developed and well-nourished.  HENT:  Head: Normocephalic and atraumatic.  Eyes: Pupils are equal, round, and reactive to light. Conjunctivae are normal. Right eye exhibits no discharge. Left eye exhibits no discharge. No scleral icterus.  Neck: Normal range of motion. No JVD present. No tracheal deviation present.  Pulmonary/Chest: Effort normal. No stridor.  Abdominal: Soft. She exhibits no distension and no mass. There is no tenderness. There is no rebound and no guarding. No hernia.  Genitourinary:  Genitourinary Comments: Small amount of yellow discharge noted in vaginal vault-no cervical motion tenderness or masses noted  Neurological: She is alert and oriented to person, place, and time. Coordination normal.  Psychiatric: She has a normal mood and affect. Her behavior is normal. Judgment  and thought content normal.  Nursing note and vitals reviewed.    ED Treatments / Results  Labs (all labs ordered are listed, but only abnormal results are displayed) Labs Reviewed  WET PREP, GENITAL - Abnormal; Notable for the following components:      Result Value   Clue Cells Wet Prep HPF POC PRESENT (*)    WBC, Wet Prep HPF POC MANY (*)    All other components within normal limits  COMPREHENSIVE METABOLIC PANEL - Abnormal; Notable for the following components:   BUN <5 (*)    Albumin 3.4 (*)    ALT 11 (*)    All other components within normal limits  CBC WITH DIFFERENTIAL/PLATELET - Abnormal; Notable for the following components:   RBC 5.13 (*)    All other components within normal  limits  HCG, QUANTITATIVE, PREGNANCY - Abnormal; Notable for the following components:   hCG, Beta Chain, Quant, S 130,146 (*)    All other components within normal limits  URINALYSIS, ROUTINE W REFLEX MICROSCOPIC - Abnormal; Notable for the following components:   Ketones, ur 80 (*)    All other components within normal limits  ABO/RH  GC/CHLAMYDIA PROBE AMP ([place]) NOT AT [facility]    EKG None  Radiology No results found.  Procedures Procedures (including critical care time)  Medications Ordered in ED Medications - No data to display   Initial Impression / Assessment and Plan / ED Course  I have reviewed the triage vital signs and the nursing notes.  Pertinent labs & imaging results that were available during my care of the patient were reviewed by me and considered in my medical decision making (see chart for details).    Labs: Wet prep, GC, CMP, CBC, ABO Rh, hCG quant  Imaging: Ultrasound OB complete less than 14 weeks, ultrasound OB transvaginal  Consults:  Therapeutics:  Discharge Meds: Prenatal vitamins  Assessment/Plan:   27 year old female presents today with pregnancy.  Patient having minor lower abdominal pain, no signs of acute etiologies including torsion, PID, or intra-abdominal pathology.  Patient well-appearing no acute distress reassuring laboratory analysis.  Patient will be started on prenatal vitamins.  She does have clue cells on her wet prep, she is asymptomatic from this, she will discuss treatment as needed with OB/GYN.  Patient is Rh+.  Patient is given strict return precautions, follow-up information.  She verbalized understanding and agreement to today's plan had no further questions or concerns.  Final Clinical Impressions(s) / ED Diagnoses   Final diagnoses:  Pelvic pain in pregnancy    ED Discharge Orders        Ordered    Prenatal Vit-Fe Fumarate-FA (PRENATAL COMPLETE) 14-0.4 MG TABS  Daily     08/28/17 1909       Eyvonne Mechanic, PA-C 08/30/17 1802    Long, Arlyss Repress, MD 08/31/17 [phone]

## 2017-08-28 NOTE — Discharge Instructions (Signed)
Please read attached information. If you experience any new or worsening signs or symptoms please return to the emergency room for evaluation. Please follow-up with your primary care provider or specialist as discussed. Please use medication prescribed only as directed and discontinue taking if you have any concerning signs or symptoms.   °

## 2017-08-28 NOTE — ED Notes (Signed)
EDP at bedside  

## 2017-08-28 NOTE — ED Provider Notes (Signed)
Patient placed in Quick Look pathway, seen and evaluated   Chief Complaint: about [redacted] weeks pregnant, vaginal bleeding.   HPI:   Patient has pelvic pain, vaginal spotting/cramping intermittent for 4 days.  G3p2.  No ultrasounds yet this pregnancy.   ROS: No dysuria Physical Exam:   Gen: No distress  Neuro: Awake and Alert  Skin: Warm    Focused Exam: Mild LLQ/Left pelvic TTP.  Abdomen is soft, non distended.     Initiation of care has begun. The patient has been counseled on the process, plan, and necessity for staying for the completion/evaluation, and the remainder of the medical screening examination    Norman ClayHammond, Elizabeth W, PA-C 08/28/17 1351    Terrilee FilesButler, Michael C, MD 08/30/17 [phone]

## 2017-08-28 NOTE — ED Triage Notes (Signed)
Pt endorses being [redacted] weeks pregnant and having pelvic pain and vaginal spotting intermittent x 4 days.

## 2017-08-28 NOTE — ED Notes (Signed)
Patient verbalized understanding of discharge instructions and denies any further needs or questions at this time. VS stable. Patient ambulatory with steady gait.  

## 2017-08-30 LAB — GC/CHLAMYDIA PROBE AMP (~~LOC~~) NOT AT ARMC
Chlamydia: NEGATIVE
Neisseria Gonorrhea: NEGATIVE

## 2017-09-27 LAB — GLUCOSE TOLERANCE, 1 HOUR: GLUCOSE 1 HOUR GTT: 92

## 2017-10-17 ENCOUNTER — Encounter: Payer: Self-pay | Admitting: *Deleted

## 2017-10-17 NOTE — BH Specialist Note (Signed)
Integrated Behavioral Health Initial Visit  MRN: [id number] Name: Beverly Chapman  Number of Integrated Behavioral Health Clinician visits:: 1/6 Session Start time: 9:10  Session End time: 9:14 Total time: 4 minutes  Type of Service: Integrated Behavioral Health- Individual/Family Interpretor:No. Interpretor Name and Language: n/a   Warm Hand Off Completed.       SUBJECTIVE: Beverly Chapman is a 27 y.o. female accompanied by n/a Patient was referred by Nettie ElmMichael Ervin, MD for initial OB introduction to integrated behavior health services. Patient reports the following symptoms/concerns: Pt states no particular concerns today. Duration of problem: n/a; Severity of problem: n/a  OBJECTIVE: Mood: Appropriate and Affect: Appropriate Risk of harm to self or others: No plan to harm self or others  LIFE CONTEXT: Family and Social: - School/Work: - Self-Care: - Life Changes: Current pregnancy  GOALS ADDRESSED: n/a  INTERVENTIONS:  Standardized Assessments completed: GAD-7 and PHQ 9  ASSESSMENT: Patient currently experiencing Supervision of high risk pregnancy.   Patient may benefit from initial OB introduction to integrated behavior health services.  PLAN: 1. Follow up with behavioral health clinician on : As needed 2. Behavioral recommendations:  n/a 3. Referral(s): [facility] 4. "From scale of 1-10, how likely are you to follow plan?": -  Rae LipsJamie C Alvah Gilder, LCSW   [facility] 10/18/2017  Decreased Interest 0  Down, Depressed, Hopeless 0  PHQ - 2 Score 0  Altered sleeping 1  Tired, decreased energy 1  Change in appetite 0  Feeling bad or failure about yourself  0  Trouble concentrating 0  Moving slowly or fidgety/restless 0  Suicidal thoughts 0  PHQ-9 Score 2   GAD 7 : Generalized Anxiety Score 10/18/2017  Nervous, Anxious, on Edge 0  Control/stop worrying 0  Worry too much - different things 0  Trouble  relaxing 0  Restless 0  Easily annoyed or irritable 1  Afraid - awful might happen 0  Total GAD 7 Score 1

## 2017-10-18 ENCOUNTER — Telehealth: Payer: Self-pay

## 2017-10-18 ENCOUNTER — Ambulatory Visit: Payer: Self-pay | Admitting: Clinical

## 2017-10-18 ENCOUNTER — Ambulatory Visit (INDEPENDENT_AMBULATORY_CARE_PROVIDER_SITE_OTHER): Payer: Self-pay | Admitting: Obstetrics and Gynecology

## 2017-10-18 ENCOUNTER — Encounter: Payer: Self-pay | Admitting: Obstetrics and Gynecology

## 2017-10-18 VITALS — BP 107/80 | HR 82 | Wt 180.1 lb

## 2017-10-18 DIAGNOSIS — O0992 Supervision of high risk pregnancy, unspecified, second trimester: Secondary | ICD-10-CM

## 2017-10-18 DIAGNOSIS — O0990 Supervision of high risk pregnancy, unspecified, unspecified trimester: Secondary | ICD-10-CM

## 2017-10-18 DIAGNOSIS — O09899 Supervision of other high risk pregnancies, unspecified trimester: Secondary | ICD-10-CM

## 2017-10-18 DIAGNOSIS — O09212 Supervision of pregnancy with history of pre-term labor, second trimester: Secondary | ICD-10-CM

## 2017-10-18 DIAGNOSIS — O09219 Supervision of pregnancy with history of pre-term labor, unspecified trimester: Principal | ICD-10-CM

## 2017-10-18 NOTE — Progress Notes (Signed)
Subjective:  Beverly Chapman is a 27 y.o. [id number] at 17w 1/7 being seen today for ongoing prenatal care.  Transferred from [facility] d/t H/O PTD at 36 weeks following SROM. EDD based on first trimester U/S. Denies chronic medical problems or medications.  She is currently monitored for the following issues for this high-risk pregnancy and has Supervision of high risk pregnancy, antepartum and History of preterm delivery, currently pregnant on their problem list.  Patient reports no complaints.  Contractions: Not present. Vag. Bleeding: None.  Movement: Absent. Denies leaking of fluid.   The following portions of the patient's history were reviewed and updated as appropriate: allergies, current medications, past family history, past medical history, past social history, past surgical history and problem list. Problem list updated.  Objective:   Vitals:   10/18/17 0839  BP: 107/80  Pulse: 82  Weight: 180 lb 1.6 oz (81.7 kg)    Fetal Status: Fetal Heart Rate (bpm): 166   Movement: Absent     General:  Alert, oriented and cooperative. Patient is in no acute distress.  Skin: Skin is warm and dry. No rash noted.   Cardiovascular: Normal heart rate noted  Respiratory: Normal respiratory effort, no problems with respiration noted  Abdomen: Soft, gravid, appropriate for gestational age. Pain/Pressure: Present     Pelvic:  Cervical exam deferred        Extremities: Normal range of motion.  Edema: None  Mental Status: Normal mood and affect. Normal behavior. Normal judgment and thought content.   Urinalysis:      Assessment and Plan:  Pregnancy: G3P1102 at 4935w6d  1. Supervision of high risk pregnancy, antepartum [facility] Panorama today. - Genetic Screening - US MFM OB COMP + 14 WK; Future  2. History of preterm delivery, currently pregnant PTD reviewed with pt. 17OHP recommended and reviewed with pt. She is agreeable. Paper work completed today  Preterm labor  symptoms and general obstetric precautions including but not limited to vaginal bleeding, contractions, leaking of fluid and fetal movement were reviewed in detail with the patient. Please refer to After Visit Summary for other counseling recommendations.  Return in about 1 month (around 11/15/2017) for OB visit.   Hermina StaggersErvin, Rashanda Magloire L, MD

## 2017-10-18 NOTE — Progress Notes (Signed)
17p paperwork completed & faxed- patient's medicaid is not currently active

## 2017-10-18 NOTE — Patient Instructions (Signed)

## 2017-10-18 NOTE — Telephone Encounter (Signed)
Called pt to advise of new US appt. Of 10/25/17 @ 3pm, no answer, so left VM.

## 2017-10-25 ENCOUNTER — Ambulatory Visit (HOSPITAL_COMMUNITY)
Admission: RE | Admit: 2017-10-25 | Discharge: 2017-10-25 | Disposition: A | Discharge status: Home / Self Care | Payer: Self-pay | Source: Ambulatory Visit | Attending: Obstetrics and Gynecology | Admitting: Obstetrics and Gynecology

## 2017-10-25 ENCOUNTER — Encounter (HOSPITAL_COMMUNITY): Payer: Self-pay

## 2017-10-25 ENCOUNTER — Other Ambulatory Visit: Payer: Self-pay | Admitting: Obstetrics and Gynecology

## 2017-10-25 DIAGNOSIS — Z363 Encounter for antenatal screening for malformations: Secondary | ICD-10-CM

## 2017-10-25 DIAGNOSIS — O99212 Obesity complicating pregnancy, second trimester: Secondary | ICD-10-CM

## 2017-10-25 DIAGNOSIS — O0990 Supervision of high risk pregnancy, unspecified, unspecified trimester: Secondary | ICD-10-CM | POA: Insufficient documentation

## 2017-10-25 DIAGNOSIS — O09212 Supervision of pregnancy with history of pre-term labor, second trimester: Secondary | ICD-10-CM

## 2017-10-25 DIAGNOSIS — Z3A18 18 weeks gestation of pregnancy: Secondary | ICD-10-CM

## 2017-10-26 ENCOUNTER — Other Ambulatory Visit (HOSPITAL_COMMUNITY): Payer: Self-pay | Admitting: Obstetrics and Gynecology

## 2017-10-26 DIAGNOSIS — O359XX0 Maternal care for (suspected) fetal abnormality and damage, unspecified, not applicable or unspecified: Secondary | ICD-10-CM

## 2017-11-05 ENCOUNTER — Encounter: Payer: Self-pay | Admitting: General Practice

## 2017-11-05 DIAGNOSIS — O0990 Supervision of high risk pregnancy, unspecified, unspecified trimester: Secondary | ICD-10-CM

## 2017-11-07 ENCOUNTER — Encounter: Payer: Self-pay | Admitting: *Deleted

## 2017-11-09 ENCOUNTER — Ambulatory Visit (HOSPITAL_COMMUNITY): Payer: Self-pay

## 2017-11-12 ENCOUNTER — Telehealth: Payer: Self-pay | Admitting: Obstetrics & Gynecology

## 2017-11-12 ENCOUNTER — Encounter: Payer: Self-pay | Admitting: Obstetrics & Gynecology

## 2017-11-12 NOTE — Telephone Encounter (Signed)
Called patient about her missed appointment. Left a message for her to give us a call back.

## 2017-11-15 ENCOUNTER — Encounter (HOSPITAL_COMMUNITY): Payer: Self-pay

## 2017-11-22 ENCOUNTER — Encounter (HOSPITAL_COMMUNITY): Payer: Self-pay

## 2017-11-22 ENCOUNTER — Ambulatory Visit (HOSPITAL_COMMUNITY)
Admission: RE | Admit: 2017-11-22 | Discharge: 2017-11-22 | Disposition: A | Discharge status: Home / Self Care | Payer: Self-pay | Source: Ambulatory Visit | Attending: Obstetrics and Gynecology | Admitting: Obstetrics and Gynecology

## 2017-11-22 DIAGNOSIS — O359XX0 Maternal care for (suspected) fetal abnormality and damage, unspecified, not applicable or unspecified: Secondary | ICD-10-CM | POA: Insufficient documentation

## 2017-11-22 DIAGNOSIS — O99212 Obesity complicating pregnancy, second trimester: Secondary | ICD-10-CM | POA: Insufficient documentation

## 2017-11-22 DIAGNOSIS — Z362 Encounter for other antenatal screening follow-up: Secondary | ICD-10-CM | POA: Insufficient documentation

## 2017-11-22 DIAGNOSIS — O09219 Supervision of pregnancy with history of pre-term labor, unspecified trimester: Secondary | ICD-10-CM | POA: Insufficient documentation

## 2017-11-22 DIAGNOSIS — Z3A22 22 weeks gestation of pregnancy: Secondary | ICD-10-CM | POA: Insufficient documentation

## 2017-11-22 DIAGNOSIS — O09212 Supervision of pregnancy with history of pre-term labor, second trimester: Secondary | ICD-10-CM

## 2017-11-22 DIAGNOSIS — E669 Obesity, unspecified: Secondary | ICD-10-CM | POA: Insufficient documentation

## 2017-12-21 ENCOUNTER — Ambulatory Visit (INDEPENDENT_AMBULATORY_CARE_PROVIDER_SITE_OTHER): Payer: Self-pay | Admitting: Obstetrics and Gynecology

## 2017-12-21 VITALS — BP 107/66 | HR 78 | Wt 200.4 lb

## 2017-12-21 DIAGNOSIS — O09212 Supervision of pregnancy with history of pre-term labor, second trimester: Secondary | ICD-10-CM

## 2017-12-21 DIAGNOSIS — O09899 Supervision of other high risk pregnancies, unspecified trimester: Secondary | ICD-10-CM

## 2017-12-21 DIAGNOSIS — Z23 Encounter for immunization: Secondary | ICD-10-CM

## 2017-12-21 DIAGNOSIS — O0992 Supervision of high risk pregnancy, unspecified, second trimester: Secondary | ICD-10-CM

## 2017-12-21 DIAGNOSIS — O09219 Supervision of pregnancy with history of pre-term labor, unspecified trimester: Secondary | ICD-10-CM

## 2017-12-21 DIAGNOSIS — O0990 Supervision of high risk pregnancy, unspecified, unspecified trimester: Secondary | ICD-10-CM

## 2017-12-21 MED ORDER — RANITIDINE HCL 150 MG PO TABS: 150.0000 mg | ORAL_TABLET | Freq: Two times a day (BID) | ORAL | Status: DC

## 2017-12-21 NOTE — Progress Notes (Signed)
Prenatal Visit Note Date: 12/21/2017 Clinic: [facility]  Subjective:  Beverly Chapman is a 27 y.o. [id number] at [redacted]w[redacted]d being seen today for ongoing prenatal care.  She is currently monitored for the following issues for this high-risk pregnancy and has Supervision of high risk pregnancy, antepartum and History of preterm delivery, currently pregnant on their problem list.  Patient reports no complaints.   Contractions: Not present. Vag. Bleeding: None.  Movement: Present. Denies leaking of fluid.   The following portions of the patient's history were reviewed and updated as appropriate: allergies, current medications, past family history, past medical history, past social history, past surgical history and problem list. Problem list updated.  Objective:   Vitals:   12/21/17 0955  BP: 107/66  Pulse: 78  Weight: 200 lb 6.4 oz (90.9 kg)    Fetal Status: Fetal Heart Rate (bpm): 152 Fundal Height: 28 cm Movement: Present     General:  Alert, oriented and cooperative. Patient is in no acute distress.  Skin: Skin is warm and dry. No rash noted.   Cardiovascular: Normal heart rate noted  Respiratory: Normal respiratory effort, no problems with respiration noted  Abdomen: Soft, gravid, appropriate for gestational age. Pain/Pressure: Present     Pelvic:  Cervical exam deferred        Extremities: Normal range of motion.  Edema: None  Mental Status: Normal mood and affect. Normal behavior. Normal judgment and thought content.   Urinalysis:      Assessment and Plan:  Pregnancy: G3P1102 at [redacted]w[redacted]d  1. Supervision of high risk pregnancy, antepartum Routine care. 28wk labs nv - Tdap vaccine greater than or equal to 7yo IM  2. History of preterm delivery, currently pregnant Declines 17p  Preterm labor symptoms and general obstetric precautions including but not limited to vaginal bleeding, contractions, leaking of fluid and fetal movement were reviewed in detail with the  patient. Please refer to After Visit Summary for other counseling recommendations.  Return in about 2 weeks (around 01/04/2018) for hrob. 2hr GTT.   Hinds Bing, MD

## 2018-01-04 ENCOUNTER — Other Ambulatory Visit: Payer: Self-pay | Admitting: *Deleted

## 2018-01-04 DIAGNOSIS — O0990 Supervision of high risk pregnancy, unspecified, unspecified trimester: Secondary | ICD-10-CM

## 2018-01-07 ENCOUNTER — Other Ambulatory Visit: Payer: Self-pay

## 2018-01-07 ENCOUNTER — Encounter: Payer: Self-pay | Admitting: Obstetrics & Gynecology

## 2018-01-07 NOTE — Progress Notes (Deleted)
   Patient did not show up today for her scheduled appointment.   Shelva Hetzer, MD, FACOG Obstetrician & Gynecologist, [facility], [facility]  

## 2018-01-22 ENCOUNTER — Ambulatory Visit (INDEPENDENT_AMBULATORY_CARE_PROVIDER_SITE_OTHER): Payer: Self-pay | Admitting: Family Medicine

## 2018-01-22 VITALS — BP 104/61 | HR 70 | Wt 205.1 lb

## 2018-01-22 DIAGNOSIS — O09899 Supervision of other high risk pregnancies, unspecified trimester: Secondary | ICD-10-CM

## 2018-01-22 DIAGNOSIS — O09212 Supervision of pregnancy with history of pre-term labor, second trimester: Secondary | ICD-10-CM

## 2018-01-22 DIAGNOSIS — O0993 Supervision of high risk pregnancy, unspecified, third trimester: Secondary | ICD-10-CM

## 2018-01-22 DIAGNOSIS — O0990 Supervision of high risk pregnancy, unspecified, unspecified trimester: Secondary | ICD-10-CM

## 2018-01-22 DIAGNOSIS — O09219 Supervision of pregnancy with history of pre-term labor, unspecified trimester: Secondary | ICD-10-CM

## 2018-01-22 MED ORDER — PROGESTERONE MICRONIZED 200 MG PO CAPS: 200.0000 mg | ORAL_CAPSULE | Freq: Every day | ORAL | 1 refills | Status: DC

## 2018-01-22 NOTE — Progress Notes (Signed)
   PRENATAL VISIT NOTE  Subjective:  Beverly Chapman is a 27 y.o. G3P1102 at [redacted]w[redacted]d being seen today for ongoing prenatal care.  She is currently monitored for the following issues for this high-risk pregnancy and has Supervision of high risk pregnancy, antepartum and History of preterm delivery, currently pregnant on their problem list.  Patient reports pelvic pressure and pressure in her bottom like during labor.   Contractions: Not present. Vag. Bleeding: None.  Movement: Present. Denies leaking of fluid.   The following portions of the patient's history were reviewed and updated as appropriate: allergies, current medications, past family history, past medical history, past social history, past surgical history and problem list. Problem list updated.  Objective:   Vitals:   01/22/18 1541  BP: 104/61  Pulse: 70  Weight: 205 lb 1.6 oz (93 kg)    Fetal Status: Fetal Heart Rate (bpm): 152   Movement: Present     General:  Alert, oriented and cooperative. Patient is in no acute distress.  Skin: Skin is warm and dry. No rash noted.   Cardiovascular: Normal heart rate noted  Respiratory: Normal respiratory effort, no problems with respiration noted  Abdomen: Soft, gravid, appropriate for gestational age.  Pain/Pressure: Absent     Pelvic: Cervical exam deferred        Extremities: Normal range of motion.  Edema: Trace  Mental Status: Normal mood and affect. Normal behavior. Normal judgment and thought content.   Assessment and Plan:  Pregnancy: G3P1102 at [redacted]w[redacted]d  1. Supervision of high risk pregnancy, antepartum - doing well today - does have some pressure, but no contractions; will defer cervical check given hx of preterm delivery  2. History of preterm delivery, currently pregnant - discussed starting makena, which pt continues to decline - discussed using prometrium as an alternative, which pt states she will think about  Preterm labor symptoms and general obstetric precautions  including but not limited to vaginal bleeding, contractions, leaking of fluid and fetal movement were reviewed in detail with the patient. Please refer to After Visit Summary for other counseling recommendations.  Return in about 2 weeks (around 02/05/2018) for ROB.  Future Appointments  Date Time Provider Department Center  01/25/2018  8:50 AM [facility]    Gwenevere Abbot, MD

## 2018-01-25 ENCOUNTER — Other Ambulatory Visit: Payer: Self-pay

## 2018-02-05 ENCOUNTER — Encounter: Payer: Self-pay | Admitting: Advanced Practice Midwife

## 2018-02-13 ENCOUNTER — Encounter: Payer: Self-pay | Admitting: Obstetrics & Gynecology

## 2018-02-13 ENCOUNTER — Ambulatory Visit (INDEPENDENT_AMBULATORY_CARE_PROVIDER_SITE_OTHER): Payer: Self-pay | Admitting: Obstetrics & Gynecology

## 2018-02-13 VITALS — BP 99/69 | HR 93 | Wt 208.1 lb

## 2018-02-13 DIAGNOSIS — O0990 Supervision of high risk pregnancy, unspecified, unspecified trimester: Secondary | ICD-10-CM

## 2018-02-13 DIAGNOSIS — O09213 Supervision of pregnancy with history of pre-term labor, third trimester: Secondary | ICD-10-CM

## 2018-02-13 DIAGNOSIS — K0510 Chronic gingivitis, plaque induced: Secondary | ICD-10-CM | POA: Insufficient documentation

## 2018-02-13 DIAGNOSIS — O09899 Supervision of other high risk pregnancies, unspecified trimester: Secondary | ICD-10-CM

## 2018-02-13 DIAGNOSIS — O09219 Supervision of pregnancy with history of pre-term labor, unspecified trimester: Secondary | ICD-10-CM

## 2018-02-13 DIAGNOSIS — O0993 Supervision of high risk pregnancy, unspecified, third trimester: Secondary | ICD-10-CM

## 2018-02-13 NOTE — Progress Notes (Signed)
   PRENATAL VISIT NOTE  Subjective:  Beverly Chapman is a 27 y.o. G3P1102 at 5668w0d being seen today for ongoing prenatal care.  She is currently monitored for the following issues for this high-risk pregnancy and has Supervision of high risk pregnancy, antepartum; History of preterm delivery, currently pregnant; and Gingivitis on their problem list.  Patient reports pressure.  Contractions: Not present. Vag. Bleeding: Scant.  Movement: Present. Denies leaking of fluid.   The following portions of the patient's history were reviewed and updated as appropriate: allergies, current medications, past family history, past medical history, past social history, past surgical history and problem list. Problem list updated.  Objective:   Vitals:   02/13/18 1516  BP: 99/69  Pulse: 93  Weight: 208 lb 1.6 oz (94.4 kg)    Fetal Status: Fetal Heart Rate (bpm): 151   Movement: Present     General:  Alert, oriented and cooperative. Patient is in no acute distress.  Skin: Skin is warm and dry. No rash noted.   Cardiovascular: Normal heart rate noted  Respiratory: Normal respiratory effort, no problems with respiration noted  Abdomen: Soft, gravid, appropriate for gestational age.  Pain/Pressure: Present     Pelvic: Cervical exam deferred        Extremities: Normal range of motion.  Edema: Trace  Mental Status: Normal mood and affect. Normal behavior. Normal judgment and thought content.   Assessment and Plan:  Pregnancy: G3P1102 at 1468w0d  1. Supervision of high risk pregnancy, antepartum Glucose screening - Glucose tolerance, 1 hour  2. Gingivitis Gums are swollen - Ambulatory referral to Dentistry  3. History of preterm delivery, currently pregnant PTL precautions  Preterm labor symptoms and general obstetric precautions including but not limited to vaginal bleeding, contractions, leaking of fluid and fetal movement were reviewed in detail with the patient. Please refer to After Visit  Summary for other counseling recommendations.  Return in about 2 weeks (around 02/27/2018).  No future appointments.  Scheryl DarterJames Suhani Stillion, MD

## 2018-02-13 NOTE — Patient Instructions (Signed)
Gingivitis Gingivitis is redness, soreness, and swelling (inflammation) of the gums. It is caused by germs (plaque) that build up on the teeth and gums. This happens because of poor care and cleaning of the mouth and teeth (oral hygiene). This condition is usually mild and clears up with treatment and proper cleaning of the teeth. Follow these instructions at home:  Follow instructions from your dentist about how to clean your teeth. Make sure you: ? Brush your teeth two times per day with a soft-bristled toothbrush. ? Floss at least one time per day. Do this before you brush your teeth. ? Use a mouthwash as told by your dentist.  Eat a well-balanced diet. Between meals, limit your intake of foods and drinks that have sugar in them.  See a dentist on a regular basis for cleaning and checkups.  Do not use tobacco products. These include cigarettes, chewing tobacco, or e-cigarettes. If you need help quitting, ask your doctor.  Keep all follow-up visits as told by your dentist. This is important. Contact a doctor if:  You have a fever.  You have a lot of bleeding from your gums.  You have pain in your gums or teeth.  You have trouble chewing.  You have any loose or infected teeth.  You have swollen glands in your face or neck. This information is not intended to replace advice given to you by your health care provider. Make sure you discuss any questions you have with your health care provider. Document Released: 04/08/2010 Document Revised: 08/12/2015 Document Reviewed: 10/19/2014 Elsevier Interactive Patient Education  2018 [employer]  

## 2018-02-14 LAB — GLUCOSE TOLERANCE, 1 HOUR: GLUCOSE, 1HR PP: 77 mg/dL (ref 65–199)

## 2018-02-20 ENCOUNTER — Telehealth: Payer: Self-pay | Admitting: Family Medicine

## 2018-02-20 NOTE — Telephone Encounter (Signed)
Pt called stating that she would like a letter stating that she could start leave from work early. Asked patient if she spoke w/ a doctor and she said no. 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 Stated she talked to a nurse today about this concern but nothing was documented. Pt has an appt 12/12 and let her know to discuss this concern at this appt

## 2018-02-21 ENCOUNTER — Telehealth: Payer: Self-pay | Admitting: Family Medicine

## 2018-02-21 NOTE — Telephone Encounter (Signed)
LVM for pt to give the office a call back to let her know her FMLA papers have been completed.

## 2018-02-26 DIAGNOSIS — Z029 Encounter for administrative examinations, unspecified: Secondary | ICD-10-CM

## 2018-02-28 ENCOUNTER — Encounter: Payer: Self-pay | Admitting: Obstetrics and Gynecology

## 2018-02-28 ENCOUNTER — Ambulatory Visit (INDEPENDENT_AMBULATORY_CARE_PROVIDER_SITE_OTHER): Payer: Self-pay | Admitting: Obstetrics and Gynecology

## 2018-02-28 VITALS — BP 124/73 | HR 83 | Wt 209.2 lb

## 2018-02-28 DIAGNOSIS — O09213 Supervision of pregnancy with history of pre-term labor, third trimester: Secondary | ICD-10-CM

## 2018-02-28 DIAGNOSIS — O09219 Supervision of pregnancy with history of pre-term labor, unspecified trimester: Secondary | ICD-10-CM

## 2018-02-28 DIAGNOSIS — O0993 Supervision of high risk pregnancy, unspecified, third trimester: Secondary | ICD-10-CM

## 2018-02-28 DIAGNOSIS — Z113 Encounter for screening for infections with a predominantly sexual mode of transmission: Secondary | ICD-10-CM

## 2018-02-28 DIAGNOSIS — O09899 Supervision of other high risk pregnancies, unspecified trimester: Secondary | ICD-10-CM

## 2018-02-28 DIAGNOSIS — O0990 Supervision of high risk pregnancy, unspecified, unspecified trimester: Secondary | ICD-10-CM

## 2018-02-28 LAB — OB RESULTS CONSOLE GC/CHLAMYDIA: Gonorrhea: NEGATIVE

## 2018-02-28 LAB — OB RESULTS CONSOLE GBS: GBS: POSITIVE

## 2018-02-28 NOTE — Patient Instructions (Signed)
Vaginal Delivery Vaginal delivery means that you will give birth by pushing your baby out of your birth canal (vagina). A team of health care providers will help you before, during, and after vaginal delivery. Birth experiences are unique for every woman and every pregnancy, and birth experiences vary depending on where you choose to give birth. What should I do to prepare for my baby's birth? Before your baby is born, it is important to talk with your health care provider about:  Your labor and delivery preferences. These may include: ? Medicines that you may be given. ? How you will manage your pain. This might include non-medical pain relief techniques or injectable pain relief such as epidural analgesia. ? How you and your baby will be monitored during labor and delivery. ? Who may be in the labor and delivery room with you. ? Your feelings about surgical delivery of your baby (cesarean delivery, or C-section) if this becomes necessary. ? Your feelings about receiving donated blood through an IV tube (blood transfusion) if this becomes necessary.  Whether you are able: ? To take pictures or videos of the birth. ? To eat during labor and delivery. ? To move around, walk, or change positions during labor and delivery.  What to expect after your baby is born, such as: ? Whether delayed umbilical cord clamping and cutting is offered. ? Who will care for your baby right after birth. ? Medicines or tests that may be recommended for your baby. ? Whether breastfeeding is supported in your hospital or birth center. ? How long you will be in the hospital or birth center.  How any medical conditions you have may affect your baby or your labor and delivery experience.  To prepare for your baby's birth, you should also:  Attend all of your health care visits before delivery (prenatal visits) as recommended by your health care provider. This is important.  Prepare your home for your baby's  arrival. Make sure that you have: ? Diapers. ? Baby clothing. ? Feeding equipment. ? Safe sleeping arrangements for you and your baby.  Install a car seat in your vehicle. Have your car seat checked by a certified car seat installer to make sure that it is installed safely.  Think about who will help you with your new baby at home for at least the first several weeks after delivery.  What can I expect when I arrive at the birth center or hospital? Once you are in labor and have been admitted into the hospital or birth center, your health care provider may:  Review your pregnancy history and any concerns you have.  Insert an IV tube into one of your veins. This is used to give you fluids and medicines.  Check your blood pressure, pulse, temperature, and heart rate (vital signs).  Check whether your bag of water (amniotic sac) has broken (ruptured).  Talk with you about your birth plan and discuss pain control options.  Monitoring Your health care provider may monitor your contractions (uterine monitoring) and your baby's heart rate (fetal monitoring). You may need to be monitored:  Often, but not continuously (intermittently).  All the time or for long periods at a time (continuously). Continuous monitoring may be needed if: ? You are taking certain medicines, such as medicine to relieve pain or make your contractions stronger. ? You have pregnancy or labor complications.  Monitoring may be done by:  Placing a special stethoscope or a handheld monitoring device on your abdomen to   check your baby's heartbeat, and feeling your abdomen for contractions. This method of monitoring does not continuously record your baby's heartbeat or your contractions.  Placing monitors on your abdomen (external monitors) to record your baby's heartbeat and the frequency and length of contractions. You may not have to wear external monitors all the time.  Placing monitors inside of your uterus  (internal monitors) to record your baby's heartbeat and the frequency, length, and strength of your contractions. ? Your health care provider may use internal monitors if he or she needs more information about the strength of your contractions or your baby's heart rate. ? Internal monitors are put in place by passing a thin, flexible wire through your vagina and into your uterus. Depending on the type of monitor, it may remain in your uterus or on your baby's head until birth. ? Your health care provider will discuss the benefits and risks of internal monitoring with you and will ask for your permission before inserting the monitors.  Telemetry. This is a type of continuous monitoring that can be done with external or internal monitors. Instead of having to stay in bed, you are able to move around during telemetry. Ask your health care provider if telemetry is an option for you.  Physical exam Your health care provider may perform a physical exam. This may include:  Checking whether your baby is positioned: ? With the head toward your vagina (head-down). This is most common. ? With the head toward the top of your uterus (head-up or breech). If your baby is in a breech position, your health care provider may try to turn your baby to a head-down position so you can deliver vaginally. If it does not seem that your baby can be born vaginally, your provider may recommend surgery to deliver your baby. In rare cases, you may be able to deliver vaginally if your baby is head-up (breech delivery). ? Lying sideways (transverse). Babies that are lying sideways cannot be delivered vaginally.  Checking your cervix to determine: ? Whether it is thinning out (effacing). ? Whether it is opening up (dilating). ? How low your baby has moved into your birth canal.  What are the three stages of labor and delivery?  Normal labor and delivery is divided into the following three stages: Stage 1  Stage 1 is the  longest stage of labor, and it can last for hours or days. Stage 1 includes: ? Early labor. This is when contractions may be irregular, or regular and mild. Generally, early labor contractions are more than 10 minutes apart. ? Active labor. This is when contractions get longer, more regular, more frequent, and more intense. ? The transition phase. This is when contractions happen very close together, are very intense, and may last longer than during any other part of labor.  Contractions generally feel mild, infrequent, and irregular at first. They get stronger, more frequent (about every 2-3 minutes), and more regular as you progress from early labor through active labor and transition.  Many women progress through stage 1 naturally, but you may need help to continue making progress. If this happens, your health care provider may talk with you about: ? Rupturing your amniotic sac if it has not ruptured yet. ? Giving you medicine to help make your contractions stronger and more frequent.  Stage 1 ends when your cervix is completely dilated to 4 inches (10 cm) and completely effaced. This happens at the end of the transition phase. Stage 2  Once   your cervix is completely effaced and dilated to 4 inches (10 cm), you may start to feel an urge to push. It is common for the body to naturally take a rest before feeling the urge to push, especially if you received an epidural or certain other pain medicines. This rest period may last for up to 1-2 hours, depending on your unique labor experience.  During stage 2, contractions are generally less painful, because pushing helps relieve contraction pain. Instead of contraction pain, you may feel stretching and burning pain, especially when the widest part of your baby's head passes through the vaginal opening (crowning).  Your health care provider will closely monitor your pushing progress and your baby's progress through the vagina during stage 2.  Your  health care provider may massage the area of skin between your vaginal opening and anus (perineum) or apply warm compresses to your perineum. This helps it stretch as the baby's head starts to crown, which can help prevent perineal tearing. ? In some cases, an incision may be made in your perineum (episiotomy) to allow the baby to pass through the vaginal opening. An episiotomy helps to make the opening of the vagina larger to allow more room for the baby to fit through.  It is very important to breathe and focus so your health care provider can control the delivery of your baby's head. Your health care provider may have you decrease the intensity of your pushing, to help prevent perineal tearing.  After delivery of your baby's head, the shoulders and the rest of the body generally deliver very quickly and without difficulty.  Once your baby is delivered, the umbilical cord may be cut right away, or this may be delayed for 1-2 minutes, depending on your baby's health. This may vary among health care providers, hospitals, and birth centers.  If you and your baby are healthy enough, your baby may be placed on your chest or abdomen to help maintain the baby's temperature and to help you bond with each other. Some mothers and babies start breastfeeding at this time. Your health care team will dry your baby and help keep your baby warm during this time.  Your baby may need immediate care if he or she: ? Showed signs of distress during labor. ? Has a medical condition. ? Was born too early (prematurely). ? Had a bowel movement before birth (meconium). ? Shows signs of difficulty transitioning from being inside the uterus to being outside of the uterus. If you are planning to breastfeed, your health care team will help you begin a feeding. Stage 3  The third stage of labor starts immediately after the birth of your baby and ends after you deliver the placenta. The placenta is an organ that develops  during pregnancy to provide oxygen and nutrients to your baby in the womb.  Delivering the placenta may require some pushing, and you may have mild contractions. Breastfeeding can stimulate contractions to help you deliver the placenta.  After the placenta is delivered, your uterus should tighten (contract) and become firm. This helps to stop bleeding in your uterus. To help your uterus contract and to control bleeding, your health care provider may: ? Give you medicine by injection, through an IV tube, by mouth, or through your rectum (rectally). ? Massage your abdomen or perform a vaginal exam to remove any blood clots that are left in your uterus. ? Empty your bladder by placing a thin, flexible tube (catheter) into your bladder. ? Encourage   you to breastfeed your baby. After labor is over, you and your baby will be monitored closely to ensure that you are both healthy until you are ready to go home. Your health care team will teach you how to care for yourself and your baby. This information is not intended to replace advice given to you by your health care provider. Make sure you discuss any questions you have with your health care provider. Document Released: 12/14/2007 Document Revised: 09/24/2015 Document Reviewed: 03/21/2015 Elsevier Interactive Patient Education  2018 [employer]  

## 2018-02-28 NOTE — Progress Notes (Signed)
Pt states is having a lot of pressure & back pain & soreness in ankles for the last couple of days.

## 2018-02-28 NOTE — Progress Notes (Signed)
Subjective:  Beverly Chapman is a 27 y.o. [phone] at 3652w1d being seen today for ongoing prenatal care.  She is currently monitored for the following issues for this high-risk pregnancy and has Supervision of high risk pregnancy, antepartum; History of preterm delivery, currently pregnant; and Gingivitis on their problem list.  Patient reports general discomforts of pregnancy.  Contractions: Irritability. Vag. Bleeding: None.  Movement: Present. Denies leaking of fluid.   The following portions of the patient's history were reviewed and updated as appropriate: allergies, current medications, past family history, past medical history, past social history, past surgical history and problem list. Problem list updated.  Objective:   Vitals:   02/28/18 1618  BP: 124/73  Pulse: 83  Weight: 209 lb 3.2 oz (94.9 kg)    Fetal Status: Fetal Heart Rate (bpm): 132   Movement: Present     General:  Alert, oriented and cooperative. Patient is in no acute distress.  Skin: Skin is warm and dry. No rash noted.   Cardiovascular: Normal heart rate noted  Respiratory: Normal respiratory effort, no problems with respiration noted  Abdomen: Soft, gravid, appropriate for gestational age. Pain/Pressure: Present     Pelvic:  Cervical exam performed        Extremities: Normal range of motion.  Edema: None  Mental Status: Normal mood and affect. Normal behavior. Normal judgment and thought content.   Urinalysis:      Assessment and Plan:  Pregnancy: G3P1102 at 3552w1d  1. Supervision of high risk pregnancy, antepartum Stable - GC/Chlamydia probe amp [facility] - Culture, beta strep (group b only) - RPR - HIV Antibody (routine testing w rflx) - CBC  2. History of preterm delivery, currently pregnant Declined Tx  Term labor symptoms and general obstetric precautions including but not limited to vaginal bleeding, contractions, leaking of fluid and fetal movement were reviewed in detail with  the patient. Please refer to After Visit Summary for other counseling recommendations.  Return in about 1 week (around 03/07/2018) for OB visit.   Hermina StaggersErvin, Melodee Lupe L, MD

## 2018-03-01 LAB — GC/CHLAMYDIA PROBE AMP (~~LOC~~) NOT AT ARMC
CHLAMYDIA, DNA PROBE: NEGATIVE
Neisseria Gonorrhea: NEGATIVE

## 2018-03-01 LAB — RPR: RPR Ser Ql: NONREACTIVE

## 2018-03-01 LAB — CBC
HEMATOCRIT: 36 % (ref 34.0–46.6)
Hemoglobin: 11.6 g/dL (ref 11.1–15.9)
MCH: 24.8 pg — ABNORMAL LOW (ref 26.6–33.0)
MCHC: 32.2 g/dL (ref 31.5–35.7)
MCV: 77 fL — AB (ref 79–97)
Platelets: 242 10*3/uL (ref 150–450)
RBC: 4.67 x10E6/uL (ref 3.77–5.28)
RDW: 14.3 % (ref 12.3–15.4)
WBC: 10.2 10*3/uL (ref 3.4–10.8)

## 2018-03-01 LAB — HIV ANTIBODY (ROUTINE TESTING W REFLEX): HIV Screen 4th Generation wRfx: NONREACTIVE

## 2018-03-03 LAB — CULTURE, BETA STREP (GROUP B ONLY): STREP GP B CULTURE: POSITIVE — AB

## 2018-03-08 ENCOUNTER — Encounter: Payer: Self-pay | Admitting: Family Medicine

## 2018-03-15 ENCOUNTER — Ambulatory Visit (INDEPENDENT_AMBULATORY_CARE_PROVIDER_SITE_OTHER): Payer: Self-pay | Admitting: Obstetrics and Gynecology

## 2018-03-15 ENCOUNTER — Encounter: Payer: Self-pay | Admitting: Obstetrics and Gynecology

## 2018-03-15 VITALS — BP 117/65 | HR 79 | Wt 212.8 lb

## 2018-03-15 DIAGNOSIS — O0993 Supervision of high risk pregnancy, unspecified, third trimester: Secondary | ICD-10-CM

## 2018-03-15 DIAGNOSIS — O0990 Supervision of high risk pregnancy, unspecified, unspecified trimester: Secondary | ICD-10-CM

## 2018-03-15 DIAGNOSIS — O09213 Supervision of pregnancy with history of pre-term labor, third trimester: Secondary | ICD-10-CM

## 2018-03-15 DIAGNOSIS — O09899 Supervision of other high risk pregnancies, unspecified trimester: Secondary | ICD-10-CM

## 2018-03-15 DIAGNOSIS — O09219 Supervision of pregnancy with history of pre-term labor, unspecified trimester: Secondary | ICD-10-CM

## 2018-03-15 NOTE — Progress Notes (Signed)
   PRENATAL VISIT NOTE  Subjective:  Beverly Chapman is a 27 y.o. G3P1102 at 452w2d being seen today for ongoing prenatal care.  She is currently monitored for the following issues for this low-risk pregnancy and has Supervision of high risk pregnancy, antepartum; History of preterm delivery, currently pregnant; and Gingivitis on their problem list.  Patient reports occasional contractions.  Contractions: Not present. Vag. Bleeding: None.  Movement: Present. Denies leaking of fluid.   The following portions of the patient's history were reviewed and updated as appropriate: allergies, current medications, past family history, past medical history, past social history, past surgical history and problem list. Problem list updated.  Objective:   Vitals:   03/15/18 0826  BP: 117/65  Pulse: 79  Weight: 212 lb 12.8 oz (96.5 kg)    Fetal Status: Fetal Heart Rate (bpm): 131   Movement: Present     General:  Alert, oriented and cooperative. Patient is in no acute distress.  Skin: Skin is warm and dry. No rash noted.   Cardiovascular: Normal heart rate noted  Respiratory: Normal respiratory effort, no problems with respiration noted  Abdomen: Soft, gravid, appropriate for gestational age.  Pain/Pressure: Absent     Pelvic: Cervical exam deferred        Extremities: Normal range of motion.  Edema: None  Mental Status: Normal mood and affect. Normal behavior. Normal judgment and thought content.   Assessment and Plan:  Pregnancy: G3P1102 at 318w2d  1. Supervision of high risk pregnancy, antepartum Reviewed contraception options, gave info for LARCs  2. History of preterm delivery, currently pregnant   Term labor symptoms and general obstetric precautions including but not limited to vaginal bleeding, contractions, leaking of fluid and fetal movement were reviewed in detail with the patient. Please refer to After Visit Summary for other counseling recommendations.  Return in about 1 week  (around 03/22/2018) for OB visit.  Future Appointments  Date Time Provider Department Center  03/22/2018  8:15 AM [place], [facility]  03/29/2018  8:15 AM Katr[place]a BlazingSmith, [place], [facility]    Conan BowensKelly M , MD

## 2018-03-15 NOTE — Patient Instructions (Signed)
Etonogestrel implant What is this medicine? ETONOGESTREL (et oh noe JES trel) is a contraceptive (birth control) device. It is used to prevent pregnancy. It can be used for up to 3 years. This medicine may be used for other purposes; ask your health care provider or pharmacist if you have questions. COMMON BRAND NAME(S): Implanon, Nexplanon What should I tell my health care provider before I take this medicine? They need to know if you have any of these conditions: -abnormal vaginal bleeding -blood vessel disease or blood clots -breast, cervical, endometrial, ovarian, liver, or uterine cancer -diabetes -gallbladder disease -heart disease or recent heart attack -high blood pressure -high cholesterol or triglycerides -kidney disease -liver disease -migraine headaches -seizures -stroke -tobacco smoker -an unusual or allergic reaction to etonogestrel, anesthetics or antiseptics, other medicines, foods, dyes, or preservatives -pregnant or trying to get pregnant -breast-feeding How should I use this medicine? This device is inserted just under the skin on the inner side of your upper arm by a health care professional. Talk to your pediatrician regarding the use of this medicine in children. Special care may be needed. Overdosage: If you think you have taken too much of this medicine contact a poison control center or emergency room at once. NOTE: This medicine is only for you. Do not share this medicine with others. What if I miss a dose? This does not apply. What may interact with this medicine? Do not take this medicine with any of the following medications: -amprenavir -fosamprenavir This medicine may also interact with the following medications: -acitretin -aprepitant -armodafinil -bexarotene -bosentan -carbamazepine -certain medicines for fungal infections like fluconazole, ketoconazole, itraconazole and voriconazole -certain medicines to treat hepatitis, HIV or  AIDS -cyclosporine -felbamate -griseofulvin -lamotrigine -modafinil -oxcarbazepine -phenobarbital -phenytoin -primidone -rifabutin -rifampin -rifapentine -St. John's wort -topiramate This list may not describe all possible interactions. Give your health care provider a list of all the medicines, herbs, non-prescription drugs, or dietary supplements you use. Also tell them if you smoke, drink alcohol, or use illegal drugs. Some items may interact with your medicine. What should I watch for while using this medicine? This product does not protect you against HIV infection (AIDS) or other sexually transmitted diseases. You should be able to feel the implant by pressing your fingertips over the skin where it was inserted. Contact your doctor if you cannot feel the implant, and use a non-hormonal birth control method (such as condoms) until your doctor confirms that the implant is in place. Contact your doctor if you think that the implant may have broken or become bent while in your arm. You will receive a user card from your health care provider after the implant is inserted. The card is a record of the location of the implant in your upper arm and when it should be removed. Keep this card with your health records. What side effects may I notice from receiving this medicine? Side effects that you should report to your doctor or health care professional as soon as possible: -allergic reactions like skin rash, itching or hives, swelling of the face, lips, or tongue -breast lumps, breast tissue changes, or discharge -breathing problems -changes in emotions or moods -if you feel that the implant may have broken or bent while in your arm -high blood pressure -pain, irritation, swelling, or bruising at the insertion site -scar at site of insertion -signs of infection at the insertion site such as fever, and skin redness, pain or discharge -signs and symptoms of a blood clot such   as breathing  problems; changes in vision; chest pain; severe, sudden headache; pain, swelling, warmth in the leg; trouble speaking; sudden numbness or weakness of the face, arm or leg -signs and symptoms of liver injury like dark yellow or brown urine; general ill feeling or flu-like symptoms; light-colored stools; loss of appetite; nausea; right upper belly pain; unusually weak or tired; yellowing of the eyes or skin -unusual vaginal bleeding, discharge Side effects that usually do not require medical attention (report to your doctor or health care professional if they continue or are bothersome): -acne -breast pain or tenderness -headache -irregular menstrual bleeding -nausea This list may not describe all possible side effects. Call your doctor for medical advice about side effects. You may report side effects to FDA at 1-800-FDA-1088. Where should I keep my medicine? This drug is given in a hospital or clinic and will not be stored at home. NOTE: This sheet is a summary. It may not cover all possible information. If you have questions about this medicine, talk to your doctor, pharmacist, or health care provider.  2019 Elsevier/Gold Standard (2017-01-23 14:11:42) Intrauterine Device Information An intrauterine device (IUD) is a medical device that is inserted in the uterus to prevent pregnancy. It is a small, T-shaped device that has one or two nylon strings hanging down from it. The strings hang out of the lower part of the uterus (cervix) to allow for future IUD removal. There are two types of IUDs available:  Hormone IUD. This type of IUD is made of plastic and contains the hormone progestin (synthetic progesterone). A hormone IUD may last 3-5 years.  Copper IUD. This type of IUD has copper wire wrapped around it. A copper IUD may last up to 10 years. How is an IUD inserted? An IUD is inserted through the vagina and placed into the uterus with a minor medical procedure. The exact procedure for IUD  insertion may vary among health care providers and hospitals. How does an IUD work? Synthetic progesterone in a hormonal IUD prevents pregnancy by:  Thickening cervical mucus to prevent sperm from entering the uterus.  Thinning the uterine lining to prevent a fertilized egg from being implanted there. Copper in a copper IUD prevents pregnancy by making the uterus and fallopian tubes produce a fluid that kills sperm. What are the advantages of an IUD? Advantages of either type of IUD  It is highly effective in preventing pregnancy.  It is reversible. You can become pregnant shortly after the IUD is removed.  It is low-maintenance and can stay in place for a long time.  There are no estrogen-related side effects.  It can be used when breastfeeding.  It is not associated with weight gain.  It can be inserted right after childbirth, an abortion, or a miscarriage. Advantages of a hormone IUD  If it is inserted within 7 days of your period starting, it works right after it is inserted. If the hormone IUD is inserted at any other time in your cycle, you will need to use a backup method of birth control for 7 days after insertion.  It can make menstrual periods lighter.  It can reduce menstrual cramping.  It can be used for 3-5 years. Advantages of a copper IUD  It works right after it is inserted.  It can be used as a form of emergency birth control if it is inserted within 5 days after having unprotected sex.  It does not interfere with your body's natural hormones.  It can  can be used for 10 years. What are the disadvantages of an IUD?  An IUD may cause irregular menstrual bleeding for a period of time after insertion.  You may have pain during insertion and have cramping and vaginal bleeding after insertion.  An IUD may cut the uterus (uterine perforation) when it is inserted. This is rare.  An IUD may cause pelvic inflammatory disease (PID), which is an infection in the  uterus and fallopian tubes. This is rare, and it usually happens during the first 20 days after the IUD is inserted.  A copper IUD can make your menstrual flow heavier and more painful. How is an IUD removed?  You will lie on your back with your knees bent and your feet in footrests (stirrups).  A device will be inserted into your vagina to spread apart the vaginal walls (speculum). This will allow your health care provider to see the strings attached to the IUD.  Your health care provider will use a small instrument (forceps) to grasp the IUD strings and pull firmly until the IUD is removed. You may have some discomfort when the IUD is removed. Your health care provider may recommend taking over-the-counter pain relievers, such as ibuprofen, before the procedure. You may also have minor spotting for a few days after the procedure. The exact procedure for IUD removal may vary among health care providers and hospitals. Is the IUD right for me? Your health care provider will make sure you are a good candidate for an IUD and will discuss the advantages, disadvantages, and possible side effects with you. Summary  An intrauterine device (IUD) is a medical device that is inserted in the uterus to prevent pregnancy. It is a small, T-shaped device that has one or two nylon strings hanging down from it.  A hormone IUD contains the hormone progestin (synthetic progesterone). A copper IUD has copper wire wrapped around it.  Synthetic progesterone in a hormone IUD prevents pregnancy by thickening cervical mucus and thinning the walls of the uterus. Copper in a copper IUD prevents pregnancy by making the uterus and fallopian tubes produce a fluid that kills sperm.  A hormone IUD can be left in place for 3-5 years. A copper IUD can be left in place for up to 10 years.  An IUD is inserted and removed by a health care provider. You may feel some pain during insertion and removal. Your health care provider  may recommend taking over-the-counter pain medicine, such as ibuprofen, before an IUD procedure. This information is not intended to replace advice given to you by your health care provider. Make sure you discuss any questions you have with your health care provider. Document Released: 02/08/2004 Document Revised: 04/04/2016 Document Reviewed: 04/04/2016 Elsevier Interactive Patient Education  2019 Elsevier Inc.  

## 2018-03-20 NOTE — L&D Delivery Note (Signed)
Patient: Beverly Chapman MRN: [id number]  GBS status: positive, IAP given: ampicillin  Patient is a 28 y.o. now [id number] s/p NSVD at 6567w0d, who was admitted for SOL. AROM 3h 2011m prior to delivery with clear fluid.   Delivery Note At 5:00 PM a viable female was delivered via Vaginal, Spontaneous (Presentation: cephalic, compound; ROA).  APGAR: 9, 9; weight 6 lb 11.9 oz (3059 g).   Placenta status: intact, 3-vessel cord.  Cord:  with the following complications: none.  Head delivered ROA compounded by a hand on the face. No nuchal cord present. Shoulder and body delivered in usual fashion. Infant with spontaneous cry, placed on mother's abdomen, dried and bulb suctioned. Cord clamped x 2 after 1-minute delay, and cut by family member. Cord blood drawn. Placenta delivered spontaneously with gentle cord traction. Fundus firm with massage and Pitocin. Perineum inspected and found to have no lacerations, with good hemostasis noted.  Anesthesia:  epidural Episiotomy: None Lacerations: None Suture Repair: none Est. Blood Loss (mL): 258  Mom to postpartum.  Baby to Couplet care / Skin to Skin.  Beverly Chapman 03/27/2018, 7:50 PM

## 2018-03-22 ENCOUNTER — Encounter: Payer: Self-pay | Admitting: Obstetrics and Gynecology

## 2018-03-26 ENCOUNTER — Other Ambulatory Visit: Payer: Self-pay

## 2018-03-26 ENCOUNTER — Inpatient Hospital Stay (HOSPITAL_COMMUNITY)
Admission: AD | Admit: 2018-03-26 | Discharge: 2018-03-26 | Disposition: A | Discharge status: Home / Self Care | Payer: Medicaid Other | Source: Ambulatory Visit | Attending: Family Medicine | Admitting: Family Medicine

## 2018-03-26 ENCOUNTER — Encounter (HOSPITAL_COMMUNITY): Payer: Self-pay | Admitting: *Deleted

## 2018-03-26 DIAGNOSIS — O471 False labor at or after 37 completed weeks of gestation: Secondary | ICD-10-CM

## 2018-03-26 DIAGNOSIS — Z3A40 40 weeks gestation of pregnancy: Secondary | ICD-10-CM | POA: Insufficient documentation

## 2018-03-26 DIAGNOSIS — O479 False labor, unspecified: Secondary | ICD-10-CM

## 2018-03-26 NOTE — MAU Note (Signed)
Pt presents to MAU with complaints of contractions since last night. Denies any VB or LOF

## 2018-03-26 NOTE — Discharge Instructions (Signed)
Braxton Hicks Contractions °Contractions of the uterus can occur throughout pregnancy, but they are not always a sign that you are in labor. You may have practice contractions called Braxton Hicks contractions. These false labor contractions are sometimes confused with true labor. °What are Braxton Hicks contractions? °Braxton Hicks contractions are tightening movements that occur in the muscles of the uterus before labor. Unlike true labor contractions, these contractions do not result in opening (dilation) and thinning of the cervix. Toward the end of pregnancy (32-34 weeks), Braxton Hicks contractions can happen more often and may become stronger. These contractions are sometimes difficult to tell apart from true labor because they can be very uncomfortable. You should not feel embarrassed if you go to the hospital with false labor. °Sometimes, the only way to tell if you are in true labor is for your health care provider to look for changes in the cervix. The health care provider will do a physical exam and may monitor your contractions. If you are not in true labor, the exam should show that your cervix is not dilating and your water has not broken. °If there are no other health problems associated with your pregnancy, it is completely safe for you to be sent home with false labor. You may continue to have Braxton Hicks contractions until you go into true labor. °How to tell the difference between true labor and false labor °True labor °· Contractions last 30-70 seconds. °· Contractions become very regular. °· Discomfort is usually felt in the top of the uterus, and it spreads to the lower abdomen and low back. °· Contractions do not go away with walking. °· Contractions usually become more intense and increase in frequency. °· The cervix dilates and gets thinner. °False labor °· Contractions are usually shorter and not as strong as true labor contractions. °· Contractions are usually irregular. °· Contractions  are often felt in the front of the lower abdomen and in the groin. °· Contractions may go away when you walk around or change positions while lying down. °· Contractions get weaker and are shorter-lasting as time goes on. °· The cervix usually does not dilate or become thin. °Follow these instructions at home: ° °· Take over-the-counter and prescription medicines only as told by your health care provider. °· Keep up with your usual exercises and follow other instructions from your health care provider. °· Eat and drink lightly if you think you are going into labor. °· If Braxton Hicks contractions are making you uncomfortable: °? Change your position from lying down or resting to walking, or change from walking to resting. °? Sit and rest in a tub of warm water. °? Drink enough fluid to keep your urine pale yellow. Dehydration may cause these contractions. °? Do slow and deep breathing several times an hour. °· Keep all follow-up prenatal visits as told by your health care provider. This is important. °Contact a health care provider if: °· You have a fever. °· You have continuous pain in your abdomen. °Get help right away if: °· Your contractions become stronger, more regular, and closer together. °· You have fluid leaking or gushing from your vagina. °· You have bleeding from your vagina. °· You have low back pain that you never had before. °· You feel your baby’s head pushing down and causing pelvic pressure. °· Your baby is not moving inside you as much as it used to. °Summary °· Contractions that occur before labor are called Braxton Hicks contractions, false labor, or   practice contractions.  Braxton Hicks contractions are usually shorter, weaker, farther apart, and less regular than true labor contractions. True labor contractions usually become progressively stronger and regular, and they become more frequent.  Manage discomfort from [facility] contractions by changing position, resting in a warm bath,  drinking plenty of water, or practicing deep breathing. This information is not intended to replace advice given to you by your health care provider. Make sure you discuss any questions you have with your health care provider. Document Released: 07/20/2016 Document Revised: 12/19/2016 Document Reviewed: 07/20/2016 Elsevier Interactive Patient Education  2019 [employer]. Call your OB Clinic or go to [facility] if:  You begin to have strong, frequent contractions  Your water breaks.  Sometimes it is a big gush of fluid, sometimes it is just a trickle that keeps getting your panties wet or running down your legs  You have vaginal bleeding.  It is normal to have a small amount of spotting if your cervix was checked.   You don't feel your baby moving like normal.  If you don't, get you something to eat and drink and lay down and focus on feeling your baby move.  You should feel at least 10 movements in 2 hours.  If you don't, you should call the office or go to [facility].

## 2018-03-26 NOTE — MAU Note (Signed)
Urine in lab 

## 2018-03-27 ENCOUNTER — Encounter (HOSPITAL_COMMUNITY): Payer: Self-pay | Admitting: *Deleted

## 2018-03-27 ENCOUNTER — Inpatient Hospital Stay (HOSPITAL_COMMUNITY): Payer: Medicaid Other | Admitting: Anesthesiology

## 2018-03-27 ENCOUNTER — Encounter: Payer: Self-pay | Admitting: Obstetrics and Gynecology

## 2018-03-27 ENCOUNTER — Inpatient Hospital Stay (HOSPITAL_COMMUNITY)
Admission: AD | Admit: 2018-03-27 | Discharge: 2018-03-29 | DRG: 807 | Disposition: A | Discharge status: Home / Self Care | Payer: Medicaid Other | Attending: Obstetrics and Gynecology | Admitting: Obstetrics and Gynecology

## 2018-03-27 DIAGNOSIS — Z3483 Encounter for supervision of other normal pregnancy, third trimester: Secondary | ICD-10-CM | POA: Diagnosis present

## 2018-03-27 DIAGNOSIS — O99824 Streptococcus B carrier state complicating childbirth: Secondary | ICD-10-CM | POA: Diagnosis present

## 2018-03-27 DIAGNOSIS — Z3A40 40 weeks gestation of pregnancy: Secondary | ICD-10-CM

## 2018-03-27 DIAGNOSIS — O479 False labor, unspecified: Secondary | ICD-10-CM

## 2018-03-27 DIAGNOSIS — O0990 Supervision of high risk pregnancy, unspecified, unspecified trimester: Secondary | ICD-10-CM

## 2018-03-27 LAB — CBC
HCT: 37.4 % (ref 36.0–46.0)
Hemoglobin: 11.6 g/dL — ABNORMAL LOW (ref 12.0–15.0)
MCH: 24.8 pg — ABNORMAL LOW (ref 26.0–34.0)
MCHC: 31 g/dL (ref 30.0–36.0)
MCV: 79.9 fL — ABNORMAL LOW (ref 80.0–100.0)
Platelets: 290 10*3/uL (ref 150–400)
RBC: 4.68 MIL/uL (ref 3.87–5.11)
RDW: 16.6 % — ABNORMAL HIGH (ref 11.5–15.5)
WBC: 13.3 10*3/uL — ABNORMAL HIGH (ref 4.0–10.5)
nRBC: 0 % (ref 0.0–0.2)

## 2018-03-27 LAB — TYPE AND SCREEN
ABO/RH(D): A POS
Antibody Screen: NEGATIVE

## 2018-03-27 LAB — POCT FERN TEST: POCT Fern Test: NEGATIVE

## 2018-03-27 LAB — RPR: RPR Ser Ql: NONREACTIVE

## 2018-03-27 LAB — ABO/RH: ABO/RH(D): A POS

## 2018-03-27 MED ORDER — ONDANSETRON HCL 4 MG PO TABS: 4.0000 mg | ORAL_TABLET | ORAL | Status: DC | PRN

## 2018-03-27 MED ORDER — OXYCODONE-ACETAMINOPHEN 5-325 MG PO TABS: 1.0000 | ORAL_TABLET | ORAL | Status: DC | PRN

## 2018-03-27 MED ORDER — FENTANYL 2.5 MCG/ML BUPIVACAINE 1/10 % EPIDURAL INFUSION (WH - ANES)
INTRAMUSCULAR | Status: AC
  Filled 2018-03-27: qty 100

## 2018-03-27 MED ORDER — IBUPROFEN 600 MG PO TABS
600.0000 mg | ORAL_TABLET | Freq: Four times a day (QID) | ORAL | Status: DC
  Administered 2018-03-28 – 2018-03-29 (×6): 600 mg via ORAL
  Filled 2018-03-27 (×6): qty 1

## 2018-03-27 MED ORDER — OXYTOCIN BOLUS FROM INFUSION: 500.0000 mL | Freq: Once | INTRAVENOUS | Status: DC

## 2018-03-27 MED ORDER — EPHEDRINE 5 MG/ML INJ
10.0000 mg | INTRAVENOUS | Status: DC | PRN
  Filled 2018-03-27: qty 2

## 2018-03-27 MED ORDER — DIPHENHYDRAMINE HCL 25 MG PO CAPS: 25.0000 mg | ORAL_CAPSULE | Freq: Four times a day (QID) | ORAL | Status: DC | PRN

## 2018-03-27 MED ORDER — PHENYLEPHRINE 40 MCG/ML (10ML) SYRINGE FOR IV PUSH (FOR BLOOD PRESSURE SUPPORT)
PREFILLED_SYRINGE | INTRAVENOUS | Status: AC
  Filled 2018-03-27: qty 10

## 2018-03-27 MED ORDER — ONDANSETRON HCL 4 MG/2ML IJ SOLN: 4.0000 mg | Freq: Four times a day (QID) | INTRAMUSCULAR | Status: DC | PRN

## 2018-03-27 MED ORDER — LACTATED RINGERS IV SOLN: 500.0000 mL | Freq: Once | INTRAVENOUS | Status: DC

## 2018-03-27 MED ORDER — PRENATAL MULTIVITAMIN CH
1.0000 | ORAL_TABLET | Freq: Every day | ORAL | Status: DC
  Administered 2018-03-28: 1 via ORAL
  Filled 2018-03-27: qty 1

## 2018-03-27 MED ORDER — COCONUT OIL OIL: 1.0000 "application " | TOPICAL_OIL | Status: DC | PRN

## 2018-03-27 MED ORDER — OXYCODONE-ACETAMINOPHEN 5-325 MG PO TABS: 2.0000 | ORAL_TABLET | ORAL | Status: DC | PRN

## 2018-03-27 MED ORDER — ACETAMINOPHEN 325 MG PO TABS
650.0000 mg | ORAL_TABLET | ORAL | Status: DC | PRN
  Administered 2018-03-27 – 2018-03-28 (×2): 650 mg via ORAL
  Filled 2018-03-27 (×2): qty 2

## 2018-03-27 MED ORDER — ACETAMINOPHEN 325 MG PO TABS: 650.0000 mg | ORAL_TABLET | ORAL | Status: DC | PRN

## 2018-03-27 MED ORDER — PHENYLEPHRINE 40 MCG/ML (10ML) SYRINGE FOR IV PUSH (FOR BLOOD PRESSURE SUPPORT)
80.0000 ug | PREFILLED_SYRINGE | INTRAVENOUS | Status: DC | PRN
  Filled 2018-03-27: qty 10

## 2018-03-27 MED ORDER — SIMETHICONE 80 MG PO CHEW: 80.0000 mg | CHEWABLE_TABLET | ORAL | Status: DC | PRN

## 2018-03-27 MED ORDER — TETANUS-DIPHTH-ACELL PERTUSSIS 5-2.5-18.5 LF-MCG/0.5 IM SUSP: 0.5000 mL | Freq: Once | INTRAMUSCULAR | Status: DC

## 2018-03-27 MED ORDER — SENNOSIDES-DOCUSATE SODIUM 8.6-50 MG PO TABS
2.0000 | ORAL_TABLET | ORAL | Status: DC
  Administered 2018-03-28 (×2): 2 via ORAL
  Filled 2018-03-27 (×2): qty 2

## 2018-03-27 MED ORDER — SODIUM CHLORIDE 0.9 % IV SOLN
2.0000 g | Freq: Once | INTRAVENOUS | Status: AC
  Administered 2018-03-27: 2 g via INTRAVENOUS
  Filled 2018-03-27: qty 2

## 2018-03-27 MED ORDER — DIPHENHYDRAMINE HCL 50 MG/ML IJ SOLN: 12.5000 mg | INTRAMUSCULAR | Status: DC | PRN

## 2018-03-27 MED ORDER — FENTANYL 2.5 MCG/ML BUPIVACAINE 1/10 % EPIDURAL INFUSION (WH - ANES)
14.0000 mL/h | INTRAMUSCULAR | Status: DC | PRN
  Administered 2018-03-27: 14 mL/h via EPIDURAL

## 2018-03-27 MED ORDER — DIBUCAINE 1 % RE OINT: 1.0000 "application " | TOPICAL_OINTMENT | RECTAL | Status: DC | PRN

## 2018-03-27 MED ORDER — WITCH HAZEL-GLYCERIN EX PADS: 1.0000 "application " | MEDICATED_PAD | CUTANEOUS | Status: DC | PRN

## 2018-03-27 MED ORDER — OXYTOCIN 40 UNITS IN NORMAL SALINE INFUSION - SIMPLE MED
2.5000 [IU]/h | INTRAVENOUS | Status: DC
  Administered 2018-03-27: 2.5 [IU]/h via INTRAVENOUS
  Filled 2018-03-27: qty 1000

## 2018-03-27 MED ORDER — LACTATED RINGERS IV SOLN
INTRAVENOUS | Status: DC
  Administered 2018-03-27 (×2): via INTRAVENOUS

## 2018-03-27 MED ORDER — MEASLES, MUMPS & RUBELLA VAC IJ SOLR: 0.5000 mL | Freq: Once | INTRAMUSCULAR | Status: DC

## 2018-03-27 MED ORDER — LIDOCAINE HCL (PF) 1 % IJ SOLN
INTRAMUSCULAR | Status: DC | PRN
  Administered 2018-03-27 (×2): 5 mL via EPIDURAL

## 2018-03-27 MED ORDER — ONDANSETRON HCL 4 MG/2ML IJ SOLN: 4.0000 mg | INTRAMUSCULAR | Status: DC | PRN

## 2018-03-27 MED ORDER — FENTANYL CITRATE (PF) 100 MCG/2ML IJ SOLN: 50.0000 ug | INTRAMUSCULAR | Status: DC | PRN

## 2018-03-27 MED ORDER — BENZOCAINE-MENTHOL 20-0.5 % EX AERO: 1.0000 "application " | INHALATION_SPRAY | CUTANEOUS | Status: DC | PRN

## 2018-03-27 MED ORDER — LACTATED RINGERS IV SOLN
500.0000 mL | INTRAVENOUS | Status: DC | PRN
  Administered 2018-03-27: 1000 mL via INTRAVENOUS
  Administered 2018-03-27: 500 mL via INTRAVENOUS

## 2018-03-27 MED ORDER — LIDOCAINE HCL (PF) 1 % IJ SOLN
30.0000 mL | INTRAMUSCULAR | Status: DC | PRN
  Filled 2018-03-27: qty 30

## 2018-03-27 MED ORDER — SOD CITRATE-CITRIC ACID 500-334 MG/5ML PO SOLN: 30.0000 mL | ORAL | Status: DC | PRN

## 2018-03-27 NOTE — MAU Note (Signed)
Pt presents to MAU via EMS c/o ctx every with bloody show. +FM. No LOF.

## 2018-03-27 NOTE — Progress Notes (Signed)
Post Partum Day 1 from a SVD 03/27/2018 at 1700 after admission for active labor. GBS+, treated once with 2 g ampicillin  Subjective: no complaints, voiding, tolerating PO, up ad lib   Objective: Blood pressure 112/82, pulse 78, temperature 99.5 F (37.5 C), temperature source Oral, resp. rate 16, height 5\' 2"  (1.575 m), weight 96.2 kg, last menstrual period 06/29/2017, SpO2 96 %, unknown if currently breastfeeding.  Physical Exam:  General: alert Lochia: appropriate Uterine Fundus: firm Incision: n/a DVT Evaluation: No evidence of DVT seen on physical exam. No Tears     Recent Labs    03/27/18 0829  HGB 11.6*  HCT 37.4    Assessment/Plan:  28YO G3P2103 s/p SVD PPD 1 Bottlefeeding  Recovering as expected    Plan for discharge tomorrow   LOS: 1 day   Sigurd Sos Demosthenes Virnig 03/27/2018, 10:21 PM

## 2018-03-27 NOTE — Progress Notes (Signed)
LABOR PROGRESS NOTE  Beverly Chapman is a 28 y.o. G3P1102 at [redacted]w[redacted]d  admitted for SOL  Subjective: Feeling comfortable with epidural  Objective: BP 103/65   Pulse 83   Temp 98 F (36.7 C) (Oral)   Resp 15   Ht 5\' 2"  (1.575 m)   Wt 96.2 kg   LMP 06/29/2017 (Exact Date)   SpO2 96%   BMI 38.78 kg/m  or  Vitals:   03/27/18 1230 03/27/18 1300 03/27/18 1330 03/27/18 1400  BP: 115/67 112/66 124/66 103/65  Pulse: 73 80 83 83  Resp: 15 15 15 15   Temp:      TempSrc:      SpO2:      Weight:      Height:        Dilation: 6.5 Effacement (%): 100 Station: -1 Presentation: Vertex Exam by:: Dr Janina Mayo: baseline rate 130, moderate varibility, + acel, no decel Toco: regular contractions q7m (difficult to trace)  Labs: Lab Results  Component Value Date   WBC 13.3 (H) 03/27/2018   HGB 11.6 (L) 03/27/2018   HCT 37.4 03/27/2018   MCV 79.9 (L) 03/27/2018   PLT 290 03/27/2018    Patient Active Problem List   Diagnosis Date Noted  . Uterine contractions during pregnancy 03/27/2018  . Gingivitis 02/13/2018  . Supervision of high risk pregnancy, antepartum 10/18/2017  . History of preterm delivery, currently pregnant 10/18/2017    Assessment / Plan: 28 y.o. G3P1102 at 110w0d here for SOL  Labor: progressing. AROM now that pt is 4 hours post ampicillin  Fetal Wellbeing:  Cat 1 Pain Control:  epidural Anticipated MOD:  SVD  Gwenevere Abbot, MD  OB Fellow  03/27/2018, 2:13 PM

## 2018-03-27 NOTE — Anesthesia Pain Management Evaluation Note (Signed)
  CRNA Pain Management Visit Note  Patient: Beverly Chapman, 28 y.o., female  "Hello I am a member of the anesthesia team at [facility]. We have an anesthesia team available at all times to provide care throughout the hospital, including epidural management and anesthesia for C-section. I don't know your plan for the delivery whether it a natural birth, water birth, IV sedation, nitrous supplementation, doula or epidural, but we want to meet your pain goals."   1.Was your pain managed to your expectations on prior hospitalizations?   Yes   2.What is your expectation for pain management during this hospitalization?     Epidural  3.How can we help you reach that goal? Epidural in place and is working well  Record the patient's initial score and the patient's pain goal.   Pain: 0  Pain Goal: 4 [facility] wants you to be able to say your pain was always managed very well.  Davier Tramell 03/27/2018

## 2018-03-27 NOTE — Progress Notes (Signed)
FHT dopplered x2 minutes, accel noted.

## 2018-03-27 NOTE — Anesthesia Preprocedure Evaluation (Signed)
Anesthesia Evaluation  Patient identified by MRN, date of birth, ID band Patient awake    Reviewed: Allergy & Precautions, H&P , NPO status , Patient's Chart, lab work & pertinent test results  History of Anesthesia Complications Negative for: history of anesthetic complications  Airway Mallampati: II  TM Distance: >3 FB Neck ROM: full    Dental no notable dental hx. (+) Teeth Intact   Pulmonary neg pulmonary ROS,    Pulmonary exam normal breath sounds clear to auscultation       Cardiovascular negative cardio ROS Normal cardiovascular exam Rhythm:regular Rate:Normal     Neuro/Psych negative neurological ROS  negative psych ROS   GI/Hepatic negative GI ROS, Neg liver ROS,   Endo/Other  negative endocrine ROS  Renal/GU negative Renal ROS  negative genitourinary   Musculoskeletal   Abdominal (+) + obese,   Peds  Hematology negative hematology ROS (+)   Anesthesia Other Findings   Reproductive/Obstetrics (+) Pregnancy                             Anesthesia Physical Anesthesia Plan  ASA: II  Anesthesia Plan: Epidural   Post-op Pain Management:    Induction:   PONV Risk Score and Plan:   Airway Management Planned:   Additional Equipment:   Intra-op Plan:   Post-operative Plan:   Informed Consent: I have reviewed the patients History and Physical, chart, labs and discussed the procedure including the risks, benefits and alternatives for the proposed anesthesia with the patient or authorized representative who has indicated his/her understanding and acceptance.       Plan Discussed with:   Anesthesia Plan Comments:         Anesthesia Quick Evaluation  

## 2018-03-27 NOTE — H&P (Addendum)
OBSTETRIC ADMISSION HISTORY AND PHYSICAL  Beverly Chapman is a 27 y.o. female [phone] with IUP at [redacted]w[redacted]d by ultrasound presenting for SOL. Patient initially presented to [facility] via EMS with contractions every 2 minutes and bloody show.  She reports +FMs as well as LOF with each contraction. Patient denies blurry vision, headaches or peripheral edema, and RUQ pain.    She plans on breast feeding. Patient plans on getting outpatient circumcision. She requests Depo-Provera for birth control. She received her prenatal care at [facility]   Nursing Staff Provider  Office Location  [facility] Dating   [place] 08/28/17 9.6 week  Language  English Anatomy US  Suspected VSD not present on f/u  Flu Vaccine  08/28/17 Genetic Screen  NIPS:   AFP:   First Screen:  Quad:    TDaP vaccine   12/21/17 Hgb A1C or  GTT Early Results for Beverly, Chapman (MRN [id number]) as of 02/13/2018 15:14  Ref. Range 09/27/2017 00:00  Glucose, GTT - 1 Hour Unknown 92   Third trimester 1 hr 77  Rhogam   n/a   LAB RESULTS   Feeding Plan Breast Blood Type --/--/A POS (06/11 1443)   Contraception  depo Antibody  neg  Circumcision Yes Rubella  immune  Pediatrician  List given RPR   non-reactive  Support Person Bobby(FOB) HBsAg   non-reactive  Prenatal Classes LIST GIVEN HIV  non-reactive  BTL Consent  GBS  (For PCN allergy, check sensitivities)   VBAC Consent  Pap 05/28/15 neg    Hgb Electro  Results for Beverly, Chapman (MRN [id number]) as of 02/13/2018 15:14  Ref. Range 10/27/2005 00:00  Sickle Cell Screen Unknown negative      CF     SMA     Waterbirth  [ ]  Class [ ]  Consent [ ]  CNM visit    Dating: By [place] --->  Estimated Date of Delivery: 03/27/18   Prenatal History/Complications: GBS +  Past Medical History: Past Medical History:  Diagnosis Date  . Preterm labor     Past Surgical History: Past Surgical History:  Procedure Laterality Date  . NO PAST SURGERIES      Obstetrical History: OB History    Gravida  3   Para  2    Term  1   Preterm  1   AB  0   Living  2     SAB  0   TAB  0   Ectopic  0   Multiple  0   Live Births  2           Social History: Social History   Socioeconomic History  . Marital status: Single    Spouse name: Not on file  . Number of children: Not on file  . Years of education: Not on file  . Highest education level: Not on file  Occupational History  . Not on file  Social Needs  . Financial resource strain: Not on file  . Food insecurity:    Worry: Not on file    Inability: Not on file  . Transportation needs:    Medical: Not on file    Non-medical: Not on file  Tobacco Use  . Smoking status: Never Smoker  . Smokeless tobacco: Never Used  Substance and Sexual Activity  . Alcohol use: Not Currently    Comment: ocassionally  . Drug use: No  . Sexual activity: Not Currently    Birth control/protection: None  Lifestyle  . Physical activity:  Days per week: Not on file    Minutes per session: Not on file  . Stress: Not on file  Relationships  . Social connections:    Talks on phone: Not on file    Gets together: Not on file    Attends religious service: Not on file    Active member of club or organization: Not on file    Attends meetings of clubs or organizations: Not on file    Relationship status: Not on file  Other Topics Concern  . Not on file  Social History Narrative  . Not on file    Family History: Family History  Problem Relation Age of Onset  . ADD / ADHD Neg Hx   . Alcohol abuse Neg Hx   . Anxiety disorder Neg Hx   . Arthritis Neg Hx   . Asthma Neg Hx     Allergies: No Known Allergies  Medications Prior to Admission  Medication Sig Dispense Refill Last Dose  . acetaminophen (TYLENOL) 325 MG tablet Take 650 mg by mouth every 6 (six) hours as needed.   03/26/2018 at Unknown time  . Prenatal Vit-Fe Fumarate-FA (PRENATAL COMPLETE) 14-0.4 MG TABS Take 1 tablet by mouth daily. 60 each 0 Past Month at Unknown time  .  ranitidine (ZANTAC) 150 MG tablet Take 1 tablet (150 mg total) by mouth 2 (two) times daily. (Patient not taking: Reported on 03/27/2018)   Not Taking at Unknown time     Review of Systems   All systems reviewed and negative except as stated in HPI  Blood pressure 123/81, pulse 95, temperature 98.4 F (36.9 C), temperature source Oral, resp. rate 15, height 5\' 2"  (1.575 m), weight 96.2 kg, last menstrual period 06/29/2017, SpO2 98 %. General appearance: alert, cooperative and no distress Lungs: clear to auscultation bilaterally Heart: regular rate and rhythm Abdomen: soft, non-tender; bowel sounds normal Extremities: Homans sign is negative, no sign of DVT Presentation: cephalic Fetal monitoringBaseline: 135 bpm, Variability: Good {> 6 bpm), Accelerations: Reactive and Decelerations: Absent Uterine activity Frequency: Every 2-6 minutes Dilation: 5 Effacement (%): 90 Station: -2 Exam by:: Beverly Chapman, RNC   Prenatal labs: ABO, Rh: --/--/A POS (01/08 0825) Antibody: NEG (01/08 0825) Rubella: Immune (06/11 0000) RPR: Non Reactive (12/12 1642)  HBsAg: Negative (06/11 0000)  HIV: Non Reactive (12/12 1642)  GBS: Positive (12/12 0000)  1 hr Glucola 92 Genetic screening  negative Anatomy US Normal.   Prenatal Transfer Tool  Maternal Diabetes: No Genetic Screening: Normal Maternal Ultrasounds/Referrals: Normal Fetal Ultrasounds or other Referrals:  Fetal echo performed for possible VSD. Patient did not show for echo, however, repeat fetal anatomy scan did not identify VSD. Maternal Substance Abuse:  No Significant Maternal Medications:  None Significant Maternal Lab Results: Lab values include: Group B Strep positive  Results for orders placed or performed during the hospital encounter of 03/27/18 (from the past 24 hour(s))  Fern Test   Collection Time: 03/27/18  8:13 AM  Result Value Ref Range   POCT Fern Test Negative = intact amniotic membranes   Type and screen [facility]   Collection Time: 03/27/18  8:25 AM  Result Value Ref Range   ABO/RH(D) A POS    Antibody Screen NEG    Sample Expiration      03/30/2018 Performed at [facility], [address] [place], [place] [id number]   CBC   Collection Time: 03/27/18  8:29 AM  Result Value Ref Range   WBC  13.3 (H) 4.0 - 10.5 K/uL   RBC 4.68 3.87 - 5.11 MIL/uL   Hemoglobin 11.6 (L) 12.0 - 15.0 g/dL   HCT 96.037.4 45.436.0 - 09.846.0 %   MCV 79.9 (L) 80.0 - 100.0 fL   MCH 24.8 (L) 26.0 - 34.0 pg   MCHC 31.0 30.0 - 36.0 g/dL   RDW 11.916.6 (H) 14.711.5 - 82.915.5 %   Platelets 290 150 - 400 K/uL   nRBC 0.0 0.0 - 0.2 %    Patient Active Problem List   Diagnosis Date Noted  . Uterine contractions during pregnancy 03/27/2018  . Gingivitis 02/13/2018  . Supervision of high risk pregnancy, antepartum 10/18/2017  . History of preterm delivery, currently pregnant 10/18/2017    Assessment/Plan:  Gladstone Pihbony M Scheer is a 28 y.o. G3P1102 at 1075w0d here for SOL.   #Labor: expectant management  #Pain: Epidural #FWB: Category 1 #ID:  GBS positive. Started on Ampicillin 2g #MOF: both #MOC:Depo-Prevera #Circ:  outpatient  Melanie Mermiges, Student-PA  03/27/2018, 10:08 AM   OB FELLOW HISTORY AND PHYSICAL ATTESTATION  I have seen and examined this patient; I agree with above documentation in the student's note.  Presents in active labor. Will hold off on any augmentation until after patient has received at least 4 hours of GBS ppx.  Then plan on AROM after pt is comfortable.   Gwenevere AbbotNimeka Darcella Shiffman, MD  OB Fellow  03/27/2018, 2:47 PM

## 2018-03-27 NOTE — Discharge Summary (Addendum)
Postpartum Discharge Summary     Patient Name: Beverly Chapman DOB: [birth year] MRN: [id number]  Date of admission: 03/27/2018 Delivering Provider: Gwenevere Abbot   Date of discharge: 03/29/2018  Admitting diagnosis: 40WKS CTX Intrauterine pregnancy: [redacted]w[redacted]d     Secondary diagnosis:  Principal Problem:   SVD (spontaneous vaginal delivery)  Additional problems: None     Discharge diagnosis: Term Pregnancy Delivered                                                                                                Post partum procedures:none  Augmentation: none  Complications: None  Hospital course:  Onset of Labor With Vaginal Delivery     28 y.o. yo [id number] at [redacted]w[redacted]d was admitted in Active Labor on 03/27/2018. Patient had an uncomplicated labor and delivery course as follows:    DELIVERY NOTE: GBS status: positive, IAP given: ampicillin  Patient is a 28 y.o. now [id number] s/p NSVD at [redacted]w[redacted]d, who was admitted for SOL. AROM 3h 41m prior to delivery with clear fluid.   At 5:00 PM a viable female was delivered via Vaginal, Spontaneous (Presentation: cephalic, compound; ROA).  APGAR: 9, 9; weight 6 lb 11.9 oz (3059 g).   Placenta status: intact, 3-vessel cord.  Cord:  with the following complications: none.  Head delivered ROA compounded by a hand on the face. No nuchal cord present. Shoulder and body delivered in usual fashion. Infant with spontaneous cry, placed on mother's abdomen, dried and bulb suctioned. Cord clamped x 2 after 1-minute delay, and cut by family member. Cord blood drawn. Placenta delivered spontaneously with gentle cord traction. Fundus firm with massage and Pitocin. Perineum inspected and found to have no lacerations, with good hemostasis noted.  Anesthesia:  epidural Episiotomy: None Lacerations: None Suture Repair: none Est. Blood Loss (mL): 258   Pateint had an uncomplicated postpartum course.  She is ambulating, tolerating a regular diet, passing flatus, and  urinating well. Patient is discharged home in stable condition on 03/27/18. Depo Provera given prior to admission.    Magnesium Sulfate recieved: No BMZ received: No  Physical exam  Vitals:   03/27/18 1815 03/27/18 1845 03/27/18 1853 03/27/18 1900  BP: 106/62 113/66 106/68 116/75  Pulse: 70 68 77 68  Resp:   16 16  Temp:    98.8 F (37.1 C)  TempSrc:    Oral  SpO2:      Weight:      Height:       General: alert, cooperative and no distress  Chest: Lungs CTA, Heart RRR Abdomen: Soft,NonTender,  Lochia: Appropriate Uterine Fundus: Firm at Umbilicus Incision: N/A DVT Evaluation: No evidence of DVT seen on physical exam.  Labs: Lab Results  Component Value Date   WBC 13.3 (H) 03/27/2018   HGB 11.6 (L) 03/27/2018   HCT 37.4 03/27/2018   MCV 79.9 (L) 03/27/2018   PLT 290 03/27/2018   CMP Latest Ref Rng & Units 08/28/2017  Glucose 65 - 99 mg/dL 79  BUN 6 - 20 mg/dL <7(G)  Creatinine 7.15 - 1.00 mg/dL 9.53  Sodium 967 - 289  mmol/L 136  Potassium 3.5 - 5.1 mmol/L 4.0  Chloride 101 - 111 mmol/L 103  CO2 22 - 32 mmol/L 25  Calcium 8.9 - 10.3 mg/dL 9.4  Total Protein 6.5 - 8.1 g/dL 7.9  Total Bilirubin 0.3 - 1.2 mg/dL 0.6  Alkaline Phos 38 - 126 U/L 46  AST 15 - 41 U/L 17  ALT 14 - 54 U/L 11(L)    Discharge instruction: per After Visit Summary and "Baby and Me Booklet". Pain Management, Peri-Care, Breast Care Who and When to call for postpartum complications. Information Sheet(s) given Care after vaginal delivery and Postpartum Baby Blues.   After visit meds:  Allergies as of 03/29/2018   No Known Allergies     Medication List    TAKE these medications   acetaminophen 325 MG tablet Commonly known as:  TYLENOL Take 650 mg by mouth every 6 (six) hours as needed.   benzocaine-Menthol 20-0.5 % Aero Commonly known as:  DERMOPLAST Apply 1 application topically as needed for irritation (perineal discomfort).   ibuprofen 600 MG tablet Commonly known as:   ADVIL,MOTRIN Take 1 tablet (600 mg total) by mouth every 6 (six) hours as needed.   PRENATAL COMPLETE 14-0.4 MG Tabs Take 1 tablet by mouth daily.   ranitidine 150 MG tablet Commonly known as:  ZANTAC Take 1 tablet (150 mg total) by mouth 2 (two) times daily.       Diet: routine diet  Activity: Advance as tolerated. Pelvic rest for 6 weeks.   Outpatient follow up:4 weeks Follow up Appt:No future appointments. Follow up Visit: Please schedule this patient for Postpartum visit in: 4 weeks with the following provider: Any provider For C/S patients schedule nurse incision check in weeks 2 weeks: no Low risk pregnancy complicated by: None Delivery mode:  SVD Anticipated Birth Control:  Depo PP Procedures needed: None  Schedule Integrated BH visit: no  Newborn Data: Live born female  Birth Weight: 6 lb 11.9 oz (3059 g) APGAR: 9, 9  Newborn Delivery   Birth date/time:  03/27/2018 17:00:00 Delivery type:  Vaginal, Spontaneous     Baby Feeding: Breast Disposition:home with mother   Cherre Robins, CNM  03/29/2018 10:34 AM

## 2018-03-27 NOTE — Progress Notes (Signed)
Pt reports she's leaking fluid with each ctx.

## 2018-03-27 NOTE — Anesthesia Procedure Notes (Signed)
Epidural Patient location during procedure: OB Start time: 03/27/2018 9:20 AM End time: 03/27/2018 9:30 AM  Staffing Anesthesiologist: Leonides Grills, MD Performed: anesthesiologist   Preanesthetic Checklist Completed: patient identified, site marked, pre-op evaluation, timeout performed, IV checked, risks and benefits discussed and monitors and equipment checked  Epidural Patient position: sitting Prep: DuraPrep Patient monitoring: heart rate, cardiac monitor, continuous pulse ox and blood pressure Approach: midline Location: L4-L5 Injection technique: LOR air  Needle:  Needle type: Tuohy  Needle gauge: 17 G Needle length: 9 cm Needle insertion depth: 7 cm Catheter type: closed end flexible Catheter size: 19 Gauge Catheter at skin depth: 12 cm Test dose: negative and Other  Assessment Events: blood not aspirated, injection not painful, no injection resistance and negative IV test  Additional Notes Informed consent obtained prior to proceeding including risk of failure, 1% risk of PDPH, risk of minor discomfort and bruising. Discussed alternatives to epidural analgesia and patient desires to proceed.  Timeout performed pre-procedure verifying patient name, procedure, and platelet count.  Patient tolerated procedure well. Reason for block:procedure for pain

## 2018-03-28 NOTE — Lactation Note (Signed)
This note was copied from a baby's chart. Lactation Consultation Note  Patient Name: Beverly Chapman [id number] Date: 03/28/2018   P3, 8 hour female infant. Per mom, she is not breastfeeding only formula although  Mother's feeding choice at admission was breast and formula feeding.   Maternal Data    Feeding Feeding Type: Bottle Fed - Formula Nipple Type: Slow - flow  LATCH Score                   Interventions    Lactation Tools Discussed/Used     Consult Status      Danelle Earthly 03/28/2018, 1:54 AM

## 2018-03-28 NOTE — Anesthesia Postprocedure Evaluation (Signed)
Anesthesia Post Note  Patient: Beverly Chapman  Procedure(s) Performed: AN AD HOC LABOR EPIDURAL     Patient location during evaluation: Mother Baby Anesthesia Type: Epidural Level of consciousness: awake and alert and oriented Pain management: satisfactory to patient Vital Signs Assessment: post-procedure vital signs reviewed and stable Respiratory status: spontaneous breathing and nonlabored ventilation Cardiovascular status: stable Postop Assessment: no headache, no backache, no signs of nausea or vomiting, adequate PO intake, patient able to bend at knees and able to ambulate (patient up walking) Anesthetic complications: no    Last Vitals:  Vitals:   03/28/18 0000 03/28/18 0400  BP: 97/65 107/76  Pulse: 80 65  Resp: 18 16  Temp: 36.9 C 36.6 C  SpO2: 96% 97%    Last Pain:  Vitals:   03/28/18 0608  TempSrc:   PainSc: 0-No pain   Pain Goal:                 Madison Hickman

## 2018-03-29 ENCOUNTER — Encounter: Payer: Self-pay | Admitting: Advanced Practice Midwife

## 2018-03-29 MED ORDER — BENZOCAINE-MENTHOL 20-0.5 % EX AERO: 1.0000 "application " | INHALATION_SPRAY | CUTANEOUS | Status: DC | PRN

## 2018-03-29 MED ORDER — MEDROXYPROGESTERONE ACETATE 150 MG/ML IM SUSP
150.0000 mg | Freq: Once | INTRAMUSCULAR | Status: AC
  Administered 2018-03-29: 150 mg via INTRAMUSCULAR
  Filled 2018-03-29: qty 1

## 2018-03-29 MED ORDER — IBUPROFEN 600 MG PO TABS: 600.0000 mg | ORAL_TABLET | Freq: Four times a day (QID) | ORAL | 0 refills | Status: DC | PRN

## 2018-03-29 NOTE — Discharge Instructions (Addendum)
Postpartum Baby Blues The postpartum period begins right after the birth of a baby. During this time, there is often a lot of joy and excitement. It is also a time of many changes in the life of the parents. No matter how many times a mother gives birth, each child brings new challenges to the family, including different ways of relating to one another. It is common to have feelings of excitement along with confusing changes in moods, emotions, and thoughts. You may feel happy one minute and sad or stressed the next. These feelings of sadness usually happen in the period right after you have your baby, and they go away within a week or two. This is called the "baby blues." What are the causes? There is no known cause of baby blues. It is likely caused by a combination of factors. However, changes in hormone levels after childbirth are believed to trigger some of the symptoms. Other factors that can play a role in these mood changes include:  Lack of sleep.  Stressful life events, such as poverty, caring for a loved one, or death of a loved one.  Genetics. What are the signs or symptoms? Symptoms of this condition include:  Brief changes in mood, such as going from extreme happiness to sadness.  Decreased concentration.  Difficulty sleeping.  Crying spells and tearfulness.  Loss of appetite.  Irritability.  Anxiety. If the symptoms of baby blues last for more than 2 weeks or become more severe, you may have postpartum depression. How is this diagnosed? This condition is diagnosed based on an evaluation of your symptoms. There are no medical or lab tests that lead to a diagnosis, but there are various questionnaires that a health care provider may use to identify women with the baby blues or postpartum depression. How is this treated? Treatment is not needed for this condition. The baby blues usually go away on their own in 1-2 weeks. Social support is often all that is needed. You will  be encouraged to get adequate sleep and rest. Follow these instructions at home: Lifestyle      Get as much rest as you can. Take a nap when the baby sleeps.  Exercise regularly as told by your health care provider. Some women find yoga and walking to be helpful.  Eat a balanced and nourishing diet. This includes plenty of fruits and vegetables, whole grains, and lean proteins.  Do little things that you enjoy. Have a cup of tea, take a bubble bath, read your favorite magazine, or listen to your favorite music.  Avoid alcohol.  Ask for help with household chores, cooking, grocery shopping, or running errands. Do not try to do everything yourself. Consider hiring a postpartum doula to help. This is a professional who specializes in providing support to new mothers.  Try not to make any major life changes during pregnancy or right after giving birth. This can add stress. General instructions  Talk to people close to you about how you are feeling. Get support from your partner, family members, friends, or other new moms. You may want to join a support group.  Find ways to cope with stress. This may include: ? Writing your thoughts and feelings in a journal. ? Spending time outside. ? Spending time with people who make you laugh.  Try to stay positive in how you think. Think about the things you are grateful for.  Take over-the-counter and prescription medicines only as told by your health care provider.    Let your health care provider know if you have any concerns.  Keep all postpartum visits as told by your health care provider. This is important. Contact a health care provider if:  Your baby blues do not go away after 2 weeks. Get help right away if:  You have thoughts of taking your own life (suicidal thoughts).  You think you may harm the baby or other people.  You see or hear things that are not there (hallucinations). Summary  After giving birth, you may feel happy  one minute and sad or stressed the next. Feelings of sadness that happen right after the baby is born and go away after a week or two are called the "baby blues."  You can manage the baby blues by getting enough rest, eating a healthy diet, exercising, spending time with supportive people, and finding ways to cope with stress.  If feelings of sadness and stress last longer than 2 weeks or get in the way of caring for your baby, talk to your health care provider. This may mean you have postpartum depression. This information is not intended to replace advice given to you by your health care provider. Make sure you discuss any questions you have with your health care provider. Document Released: 12/09/2003 Document Revised: 05/02/2016 Document Reviewed: 05/02/2016 Elsevier Interactive Patient Education  2019 [employer]  Vaginal Delivery, Care After Refer to this sheet in the next few weeks. These discharge instructions provide you with information on caring for yourself after delivery. Your caregiver may also give you specific instructions. Your treatment has been planned according to the most current medical practices available, but problems sometimes occur. Call your caregiver if you have any problems or questions after you go home. HOME CARE INSTRUCTIONS 1. Take over-the-counter or prescription medicines only as directed by your caregiver or pharmacist. 2. Do not drink alcohol, especially if you are breastfeeding or taking medicine to relieve pain. 3. Do not smoke tobacco. 4. Continue to use good perineal care. Good perineal care includes: 1. Wiping your perineum from back to front 2. Keeping your perineum clean. 3. You can do sitz baths twice a day, to help keep this area clean 5. Do not use tampons, douche or have sex until your caregiver says it is okay. 6. Shower only and avoid sitting in submerged water, aside from sitz baths 7. Wear a well-fitting bra that provides breast  support. 8. Eat healthy foods. 9. Drink enough fluids to keep your urine clear or pale yellow. 10. Eat high-fiber foods such as whole grain cereals and breads, brown rice, beans, and fresh fruits and vegetables every day. These foods may help prevent or relieve constipation. 11. Avoid constipation with high fiber foods or medications, such as miralax or metamucil 12. Follow your caregiver's recommendations regarding resumption of activities such as climbing stairs, driving, lifting, exercising, or traveling. 13. Talk to your caregiver about resuming sexual activities. Resumption of sexual activities is dependent upon your risk of infection, your rate of healing, and your comfort and desire to resume sexual activity. 14. Try to have someone help you with your household activities and your newborn for at least a few days after you leave the hospital. 15. Rest as much as possible. Try to rest or take a nap when your newborn is sleeping. 16. Increase your activities gradually. 17. Keep all of your scheduled postpartum appointments. It is very important to keep your scheduled follow-up appointments. At these appointments, your caregiver will be checking to make  sure that you are healing physically and emotionally. SEEK MEDICAL CARE IF:   You are passing large clots from your vagina. Save any clots to show your caregiver.  You have a foul smelling discharge from your vagina.  You have trouble urinating.  You are urinating frequently.  You have pain when you urinate.  You have a change in your bowel movements.  You have increasing redness, pain, or swelling near your vaginal incision (episiotomy) or vaginal tear.  You have pus draining from your episiotomy or vaginal tear.  Your episiotomy or vaginal tear is separating.  You have painful, hard, or reddened breasts.  You have a severe headache.  You have blurred vision or see spots.  You feel sad or depressed.  You have thoughts of  hurting yourself or your newborn.  You have questions about your care, the care of your newborn, or medicines.  You are dizzy or light-headed.  You have a rash.  You have nausea or vomiting.  You were breastfeeding and have not had a menstrual period within 12 weeks after you stopped breastfeeding.  You are not breastfeeding and have not had a menstrual period by the 12th week after delivery.  You have a fever. SEEK IMMEDIATE MEDICAL CARE IF:   You have persistent pain.  You have chest pain.  You have shortness of breath.  You faint.  You have leg pain.  You have stomach pain.  Your vaginal bleeding saturates two or more sanitary pads in 1 hour. MAKE SURE YOU:   Understand these instructions.  Will watch your condition.  Will get help right away if you are not doing well or get worse. Document Released: 03/03/2000 Document Revised: 07/21/2013 Document Reviewed: 11/01/2011 [facility] Patient Information 2015 [place], [place]. This information is not intended to replace advice given to you by your health care provider. Make sure you discuss any questions you have with your health care provider.  Sitz Bath A sitz bath is a warm water bath taken in the sitting position. The water covers only the hips and butt (buttocks). We recommend using one that fits in the toilet, to help with ease of use and cleanliness. It may be used for either healing or cleaning purposes. Sitz baths are also used to relieve pain, itching, or muscle tightening (spasms). The water may contain medicine. Moist heat will help you heal and relax.  HOME CARE  Take 3 to 4 sitz baths a day. 18. Fill the bathtub half-full with warm water. 19. Sit in the water and open the drain a little. 20. Turn on the warm water to keep the tub half-full. Keep the water running constantly. 21. Soak in the water for 15 to 20 minutes. 22. After the sitz bath, pat the affected area dry. GET HELP RIGHT AWAY IF: You get worse  instead of better. Stop the sitz baths if you get worse. MAKE SURE YOU:  Understand these instructions.  Will watch your condition.  Will get help right away if you are not doing well or get worse. Document Released: 04/13/2004 Document Revised: 11/29/2011 Document Reviewed: 07/04/2010 [facility] Patient Information 2015 [place], [place]. This information is not intended to replace advice given to you by your health care provider. Make sure you discuss any questions you have with your health care provider.

## 2018-04-25 ENCOUNTER — Encounter: Payer: Self-pay | Admitting: Obstetrics and Gynecology

## 2018-04-25 ENCOUNTER — Telehealth: Payer: Self-pay | Admitting: Obstetrics and Gynecology

## 2018-04-25 ENCOUNTER — Ambulatory Visit: Payer: Self-pay | Admitting: Obstetrics and Gynecology

## 2018-04-25 NOTE — Telephone Encounter (Signed)
Called the patient via both numbers available in Epic. I left a message on the mobile option informing the patient to call our office. Sending a missed appointment letter to the patient.

## 2018-11-26 DIAGNOSIS — Z3009 Encounter for other general counseling and advice on contraception: Secondary | ICD-10-CM | POA: Diagnosis not present

## 2018-11-26 DIAGNOSIS — Z3200 Encounter for pregnancy test, result unknown: Secondary | ICD-10-CM | POA: Diagnosis not present

## 2019-04-29 ENCOUNTER — Encounter: Payer: Self-pay | Admitting: *Deleted

## 2020-03-20 NOTE — L&D Delivery Note (Signed)
OB/[facility]N Faculty Practice Delivery Note  Beverly Chapman is a 30 y.o. [phone] s/p NSVD at [redacted]w[redacted]d. She was admitted for IOL for GDMA1.   ROM: 3h 58m with clear fluid GBS Status:  Negative/-- (07/05 1143) Maximum Maternal Temperature: 98.23F  Labor Progress: Initial SVE: 5.5/75/-2. She then progressed to complete.   Delivery Date/Time: 10/05/2020 15:55 Delivery: Called to room and patient was complete at +3 station. Began pushing. Head delivered LOA. No nuchal cord present. Shoulder and body delivered in usual fashion. Infant with spontaneous cry, placed on mother's abdomen, dried and stimulated. Short cord clamped x 2 after 1-minute delay, and cut by FOB. Cord blood drawn. Placenta delivered spontaneously with gentle cord traction. Fundus firm with massage and Pitocin. Labia, perineum, vagina, and cervix inspected inspected with no lacerations.   Baby Weight: pending  Placenta: Sent to L&D Complications: None Lacerations: None QBL: 100 mL Analgesia: Epidural   Infant:  APGAR (1 MIN):  9 APGAR (5 MINS):  9 APGAR (10 MINS):     Jen Mow, DO OB Family Medicine, [facility] for [employer], [facility] 10/05/2020, 4:07 PM

## 2020-04-19 DIAGNOSIS — K117 Disturbances of salivary secretion: Secondary | ICD-10-CM | POA: Diagnosis not present

## 2020-04-19 DIAGNOSIS — Z8759 Personal history of other complications of pregnancy, childbirth and the puerperium: Secondary | ICD-10-CM | POA: Diagnosis not present

## 2020-04-19 DIAGNOSIS — E669 Obesity, unspecified: Secondary | ICD-10-CM | POA: Diagnosis not present

## 2020-04-19 DIAGNOSIS — Z3482 Encounter for supervision of other normal pregnancy, second trimester: Secondary | ICD-10-CM | POA: Diagnosis not present

## 2020-04-19 DIAGNOSIS — Z8751 Personal history of pre-term labor: Secondary | ICD-10-CM | POA: Diagnosis not present

## 2020-04-20 ENCOUNTER — Other Ambulatory Visit: Payer: Self-pay | Admitting: Nurse Practitioner

## 2020-04-20 DIAGNOSIS — Z8751 Personal history of pre-term labor: Secondary | ICD-10-CM

## 2020-05-03 ENCOUNTER — Encounter: Payer: Medicaid Other | Admitting: Obstetrics and Gynecology

## 2020-05-05 ENCOUNTER — Encounter: Payer: Medicaid Other | Admitting: Obstetrics & Gynecology

## 2020-05-11 ENCOUNTER — Encounter: Payer: Self-pay | Admitting: *Deleted

## 2020-05-19 ENCOUNTER — Ambulatory Visit: Payer: Medicaid Other | Admitting: *Deleted

## 2020-05-19 ENCOUNTER — Other Ambulatory Visit: Payer: Self-pay

## 2020-05-19 ENCOUNTER — Ambulatory Visit: Payer: Medicaid Other

## 2020-05-19 ENCOUNTER — Encounter: Payer: Self-pay | Admitting: *Deleted

## 2020-05-19 ENCOUNTER — Other Ambulatory Visit: Payer: Self-pay | Admitting: Obstetrics

## 2020-05-19 ENCOUNTER — Ambulatory Visit: Payer: Medicaid Other | Attending: Nurse Practitioner

## 2020-05-19 DIAGNOSIS — O99212 Obesity complicating pregnancy, second trimester: Secondary | ICD-10-CM

## 2020-05-19 DIAGNOSIS — F1910 Other psychoactive substance abuse, uncomplicated: Secondary | ICD-10-CM | POA: Diagnosis not present

## 2020-05-19 DIAGNOSIS — O99322 Drug use complicating pregnancy, second trimester: Secondary | ICD-10-CM

## 2020-05-19 DIAGNOSIS — E669 Obesity, unspecified: Secondary | ICD-10-CM | POA: Diagnosis not present

## 2020-05-19 DIAGNOSIS — Z8751 Personal history of pre-term labor: Secondary | ICD-10-CM | POA: Diagnosis not present

## 2020-05-19 DIAGNOSIS — Z362 Encounter for other antenatal screening follow-up: Secondary | ICD-10-CM

## 2020-05-19 DIAGNOSIS — O09212 Supervision of pregnancy with history of pre-term labor, second trimester: Secondary | ICD-10-CM

## 2020-05-19 DIAGNOSIS — Z3A19 19 weeks gestation of pregnancy: Secondary | ICD-10-CM

## 2020-05-25 ENCOUNTER — Telehealth: Payer: Self-pay | Admitting: Genetic Counselor

## 2020-05-25 NOTE — Telephone Encounter (Signed)
I called Ms. Farooq to schedule a lab appointment for her to have a sample drawn for MaterniT21 noninvasive prenatal screening. Ms. Scroggins tried to have a sample drawn last week; however, the stick was unsuccessful. Ms. Agrusa opted to return to have a sample collected this Thursday at 9 AM. I will have our scheduling team add a lab appointment for her in the system. I will also place the order for her testing and provide her with the requisition to bring down to the lab Thursday morning.  Gershon Crane, MS, [facility] Genetic Counselor

## 2020-05-27 ENCOUNTER — Ambulatory Visit: Payer: Medicaid Other | Attending: Obstetrics and Gynecology

## 2020-06-16 ENCOUNTER — Ambulatory Visit: Payer: Medicaid Other | Admitting: *Deleted

## 2020-06-16 ENCOUNTER — Encounter: Payer: Self-pay | Admitting: *Deleted

## 2020-06-16 ENCOUNTER — Ambulatory Visit: Payer: Medicaid Other | Attending: Obstetrics

## 2020-06-16 ENCOUNTER — Other Ambulatory Visit: Payer: Self-pay

## 2020-06-16 ENCOUNTER — Ambulatory Visit: Payer: Medicaid Other

## 2020-06-16 DIAGNOSIS — O99212 Obesity complicating pregnancy, second trimester: Secondary | ICD-10-CM | POA: Insufficient documentation

## 2020-06-16 DIAGNOSIS — O99322 Drug use complicating pregnancy, second trimester: Secondary | ICD-10-CM

## 2020-06-16 DIAGNOSIS — Z3A23 23 weeks gestation of pregnancy: Secondary | ICD-10-CM

## 2020-06-16 DIAGNOSIS — Z362 Encounter for other antenatal screening follow-up: Secondary | ICD-10-CM | POA: Diagnosis present

## 2020-06-16 DIAGNOSIS — F1290 Cannabis use, unspecified, uncomplicated: Secondary | ICD-10-CM

## 2020-06-16 DIAGNOSIS — E669 Obesity, unspecified: Secondary | ICD-10-CM | POA: Diagnosis not present

## 2020-06-16 DIAGNOSIS — O09212 Supervision of pregnancy with history of pre-term labor, second trimester: Secondary | ICD-10-CM

## 2020-06-17 ENCOUNTER — Other Ambulatory Visit: Payer: Self-pay | Admitting: *Deleted

## 2020-06-17 DIAGNOSIS — R6252 Short stature (child): Secondary | ICD-10-CM

## 2020-06-17 LAB — MATERNIT21 PLUS CORE+SCA

## 2020-06-22 ENCOUNTER — Telehealth: Payer: Self-pay | Admitting: Obstetrics and Gynecology

## 2020-06-22 LAB — MATERNIT21 PLUS CORE+SCA
Fetal Fraction: 6
Monosomy X (Turner Syndrome): NOT DETECTED
Result (T21): NEGATIVE
Trisomy 13 (Patau syndrome): NEGATIVE
Trisomy 18 (Edwards syndrome): NEGATIVE
XXX (Triple X Syndrome): NOT DETECTED
XXY (Klinefelter Syndrome): NOT DETECTED
XYY (Jacobs Syndrome): NOT DETECTED

## 2020-06-22 NOTE — Telephone Encounter (Signed)
The patient was informed of the results of her recent MaterniT21 testing which yielded NEGATIVE results.  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  The sensitivity for trisomy 85, trisomy 69 and trisomy 14 using this testing are reported as 99.1%, 99.9% and 91.7% respectively.  Thus, while the results of this testing are highly accurate, they are not considered diagnostic at this time.  Should more definitive information be desired, the patient may still consider amniocentesis.   As requested to know by the patient, sex chromosome analysis was included for this sample.  Results are consistent with a female fetus. This is predicted with >99% accuracy. This testing also screens for sex chromosome conditions with greater than 96% accuracy and was negative for those conditions.   A maternal serum AFP only can no longer be offiered for neural tube defect screening due to the late gestational age.  The patient also inquired about OB visits.  She stated that she was seen for one visit in the first trimester at [facility] and then referred to [facility] for ultrasound and told that she was not to continue to be seen at the [facility] due to her "high risk" history.  Since that time, she has only had ultrasounds and no other OB visits.  I spoke with [facility] for [facility] and apparently she was scheduled at one time but could not come and then was not scheduled again.  They stated that they will call Ms. Granlund to schedule her next visit.   We may be reached at [phone] with any questions or concerns.   Cherly Anderson, MS, CGC

## 2020-06-24 ENCOUNTER — Telehealth: Payer: Self-pay

## 2020-06-24 NOTE — Telephone Encounter (Signed)
Pt left VM on nurse line requesting a call back. States she is [redacted] weeks pregnant and experiencing light pink spotting. Called pt. Pt reports light pink and red spotting twice overnight when using the bathroom. States there has been no spotting today. Encouraged pt to go to the [facility] with any increased bleeding/bleeding like a period. Pt will be seen in our office on 06/29/20 for new OB.

## 2020-06-29 ENCOUNTER — Encounter: Payer: Medicaid Other | Admitting: Obstetrics and Gynecology

## 2020-07-13 ENCOUNTER — Ambulatory Visit (INDEPENDENT_AMBULATORY_CARE_PROVIDER_SITE_OTHER): Payer: Medicaid Other | Admitting: Family Medicine

## 2020-07-13 ENCOUNTER — Other Ambulatory Visit (HOSPITAL_COMMUNITY)
Admission: RE | Admit: 2020-07-13 | Discharge: 2020-07-13 | Disposition: A | Discharge status: Home / Self Care | Payer: Medicaid Other | Source: Ambulatory Visit | Attending: Obstetrics and Gynecology | Admitting: Obstetrics and Gynecology

## 2020-07-13 ENCOUNTER — Other Ambulatory Visit: Payer: Self-pay

## 2020-07-13 VITALS — BP 117/70 | HR 87 | Wt 203.9 lb

## 2020-07-13 DIAGNOSIS — O0990 Supervision of high risk pregnancy, unspecified, unspecified trimester: Secondary | ICD-10-CM | POA: Insufficient documentation

## 2020-07-13 DIAGNOSIS — Z8669 Personal history of other diseases of the nervous system and sense organs: Secondary | ICD-10-CM

## 2020-07-13 DIAGNOSIS — K117 Disturbances of salivary secretion: Secondary | ICD-10-CM | POA: Insufficient documentation

## 2020-07-13 DIAGNOSIS — O09899 Supervision of other high risk pregnancies, unspecified trimester: Secondary | ICD-10-CM | POA: Diagnosis not present

## 2020-07-13 MED ORDER — CYCLOBENZAPRINE HCL 5 MG PO TABS: 5.0000 mg | ORAL_TABLET | Freq: Three times a day (TID) | ORAL | 0 refills | Status: DC | PRN

## 2020-07-13 MED ORDER — IPRATROPIUM BROMIDE 0.03 % NA SOLN: 2.0000 | Freq: Two times a day (BID) | NASAL | 12 refills | Status: DC

## 2020-07-13 NOTE — Patient Instructions (Signed)
 Contraception Choices Contraception, also called birth control, refers to methods or devices that prevent pregnancy. Hormonal methods Contraceptive implant A contraceptive implant is a thin, plastic tube that contains a hormone that prevents pregnancy. It is different from an intrauterine device (IUD). It is inserted into the upper part of the arm by a health care provider. Implants can be effective for up to 3 years. Progestin-only injections Progestin-only injections are injections of progestin, a synthetic form of the hormone progesterone. They are given every 3 months by a health care provider. Birth control pills Birth control pills are pills that contain hormones that prevent pregnancy. They must be taken once a day, preferably at the same time each day. A prescription is needed to use this method of contraception. Birth control patch The birth control patch contains hormones that prevent pregnancy. It is placed on the skin and must be changed once a week for three weeks and removed on the fourth week. A prescription is needed to use this method of contraception. Vaginal ring A vaginal ring contains hormones that prevent pregnancy. It is placed in the vagina for three weeks and removed on the fourth week. After that, the process is repeated with a new ring. A prescription is needed to use this method of contraception. Emergency contraceptive Emergency contraceptives prevent pregnancy after unprotected sex. They come in pill form and can be taken up to 5 days after sex. They work best the sooner they are taken after having sex. Most emergency contraceptives are available without a prescription. This method should not be used as your only form of birth control.   Barrier methods Female condom A female condom is a thin sheath that is worn over the penis during sex. Condoms keep sperm from going inside a woman's body. They can be used with a sperm-killing substance (spermicide) to increase their  effectiveness. They should be thrown away after one use. Female condom A female condom is a soft, loose-fitting sheath that is put into the vagina before sex. The condom keeps sperm from going inside a woman's body. They should be thrown away after one use. Diaphragm A diaphragm is a soft, dome-shaped barrier. It is inserted into the vagina before sex, along with a spermicide. The diaphragm blocks sperm from entering the uterus, and the spermicide kills sperm. A diaphragm should be left in the vagina for 6-8 hours after sex and removed within 24 hours. A diaphragm is prescribed and fitted by a health care provider. A diaphragm should be replaced every 1-2 years, after giving birth, after gaining more than 15 lb (6.8 kg), and after pelvic surgery. Cervical cap A cervical cap is a round, soft latex or plastic cup that fits over the cervix. It is inserted into the vagina before sex, along with spermicide. It blocks sperm from entering the uterus. The cap should be left in place for 6-8 hours after sex and removed within 48 hours. A cervical cap must be prescribed and fitted by a health care provider. It should be replaced every 2 years. Sponge A sponge is a soft, circular piece of polyurethane foam with spermicide in it. The sponge helps block sperm from entering the uterus, and the spermicide kills sperm. To use it, you make it wet and then insert it into the vagina. It should be inserted before sex, left in for at least 6 hours after sex, and removed and thrown away within 30 hours. Spermicides Spermicides are chemicals that kill or block sperm from entering the   cervix and uterus. They can come as a cream, jelly, suppository, foam, or tablet. A spermicide should be inserted into the vagina with an applicator at least 10-15 minutes before sex to allow time for it to work. The process must be repeated every time you have sex. Spermicides do not require a prescription.   Intrauterine  contraception Intrauterine device (IUD) An IUD is a T-shaped device that is put in a woman's uterus. There are two types:  Hormone IUD.This type contains progestin, a synthetic form of the hormone progesterone. This type can stay in place for 3-5 years.  Copper IUD.This type is wrapped in copper wire. It can stay in place for 10 years. Permanent methods of contraception Female tubal ligation In this method, a woman's fallopian tubes are sealed, tied, or blocked during surgery to prevent eggs from traveling to the uterus. Hysteroscopic sterilization In this method, a small, flexible insert is placed into each fallopian tube. The inserts cause scar tissue to form in the fallopian tubes and block them, so sperm cannot reach an egg. The procedure takes about 3 months to be effective. Another form of birth control must be used during those 3 months. Female sterilization This is a procedure to tie off the tubes that carry sperm (vasectomy). After the procedure, the man can still ejaculate fluid (semen). Another form of birth control must be used for 3 months after the procedure. Natural planning methods Natural family planning In this method, a couple does not have sex on days when the woman could become pregnant. Calendar method In this method, the woman keeps track of the length of each menstrual cycle, identifies the days when pregnancy can happen, and does not have sex on those days. Ovulation method In this method, a couple avoids sex during ovulation. Symptothermal method This method involves not having sex during ovulation. The woman typically checks for ovulation by watching changes in her temperature and in the consistency of cervical mucus. Post-ovulation method In this method, a couple waits to have sex until after ovulation. Where to find more information  Centers for Disease Control and Prevention: www.cdc.gov Summary  Contraception, also called birth control, refers to methods or  devices that prevent pregnancy.  Hormonal methods of contraception include implants, injections, pills, patches, vaginal rings, and emergency contraceptives.  Barrier methods of contraception can include female condoms, female condoms, diaphragms, cervical caps, sponges, and spermicides.  There are two types of IUDs (intrauterine devices). An IUD can be put in a woman's uterus to prevent pregnancy for 3-5 years.  Permanent sterilization can be done through a procedure for males and females. Natural family planning methods involve nothaving sex on days when the woman could become pregnant. This information is not intended to replace advice given to you by your health care provider. Make sure you discuss any questions you have with your health care provider. Document Revised: 08/11/2019 Document Reviewed: 08/11/2019 Elsevier Patient Education  2021 [employer]   Breastfeeding  Choosing to breastfeed is one of the best decisions you can make for yourself and your baby. A change in hormones during pregnancy causes your breasts to make breast milk in your milk-producing glands. Hormones prevent breast milk from being released before your baby is born. They also prompt milk flow after birth. Once breastfeeding has begun, thoughts of your baby, as well as his or her sucking or crying, can stimulate the release of milk from your milk-producing glands. Benefits of breastfeeding Research shows that breastfeeding offers many health benefits   for infants and mothers. It also offers a cost-free and convenient way to feed your baby. For your baby  Your first milk (colostrum) helps your baby's digestive system to function better.  Special cells in your milk (antibodies) help your baby to fight off infections.  Breastfed babies are less likely to develop asthma, allergies, obesity, or type 2 diabetes. They are also at lower risk for sudden infant death syndrome (SIDS).  Nutrients in breast milk are better  able to meet your baby's needs compared to infant formula.  Breast milk improves your baby's brain development. For you  Breastfeeding helps to create a very special bond between you and your baby.  Breastfeeding is convenient. Breast milk costs nothing and is always available at the correct temperature.  Breastfeeding helps to burn calories. It helps you to lose the weight that you gained during pregnancy.  Breastfeeding makes your uterus return faster to its size before pregnancy. It also slows bleeding (lochia) after you give birth.  Breastfeeding helps to lower your risk of developing type 2 diabetes, osteoporosis, rheumatoid arthritis, cardiovascular disease, and breast, ovarian, uterine, and endometrial cancer later in life. Breastfeeding basics Starting breastfeeding  Find a comfortable place to sit or lie down, with your neck and back well-supported.  Place a pillow or a rolled-up blanket under your baby to bring him or her to the level of your breast (if you are seated). Nursing pillows are specially designed to help support your arms and your baby while you breastfeed.  Make sure that your baby's tummy (abdomen) is facing your abdomen.  Gently massage your breast. With your fingertips, massage from the outer edges of your breast inward toward the nipple. This encourages milk flow. If your milk flows slowly, you may need to continue this action during the feeding.  Support your breast with 4 fingers underneath and your thumb above your nipple (make the letter "C" with your hand). Make sure your fingers are well away from your nipple and your baby's mouth.  Stroke your baby's lips gently with your finger or nipple.  When your baby's mouth is open wide enough, quickly bring your baby to your breast, placing your entire nipple and as much of the areola as possible into your baby's mouth. The areola is the colored area around your nipple. ? More areola should be visible above your  baby's upper lip than below the lower lip. ? Your baby's lips should be opened and extended outward (flanged) to ensure an adequate, comfortable latch. ? Your baby's tongue should be between his or her lower gum and your breast.  Make sure that your baby's mouth is correctly positioned around your nipple (latched). Your baby's lips should create a seal on your breast and be turned out (everted).  It is common for your baby to suck about 2-3 minutes in order to start the flow of breast milk. Latching Teaching your baby how to latch onto your breast properly is very important. An improper latch can cause nipple pain, decreased milk supply, and poor weight gain in your baby. Also, if your baby is not latched onto your nipple properly, he or she may swallow some air during feeding. This can make your baby fussy. Burping your baby when you switch breasts during the feeding can help to get rid of the air. However, teaching your baby to latch on properly is still the best way to prevent fussiness from swallowing air while breastfeeding. Signs that your baby has successfully latched onto   your nipple  Silent tugging or silent sucking, without causing you pain. Infant's lips should be extended outward (flanged).  Swallowing heard between every 3-4 sucks once your milk has started to flow (after your let-down milk reflex occurs).  Muscle movement above and in front of his or her ears while sucking. Signs that your baby has not successfully latched onto your nipple  Sucking sounds or smacking sounds from your baby while breastfeeding.  Nipple pain. If you think your baby has not latched on correctly, slip your finger into the corner of your baby's mouth to break the suction and place it between your baby's gums. Attempt to start breastfeeding again. Signs of successful breastfeeding Signs from your baby  Your baby will gradually decrease the number of sucks or will completely stop sucking.  Your baby  will fall asleep.  Your baby's body will relax.  Your baby will retain a small amount of milk in his or her mouth.  Your baby will let go of your breast by himself or herself. Signs from you  Breasts that have increased in firmness, weight, and size 1-3 hours after feeding.  Breasts that are softer immediately after breastfeeding.  Increased milk volume, as well as a change in milk consistency and color by the fifth day of breastfeeding.  Nipples that are not sore, cracked, or bleeding. Signs that your baby is getting enough milk  Wetting at least 1-2 diapers during the first 24 hours after birth.  Wetting at least 5-6 diapers every 24 hours for the first week after birth. The urine should be clear or pale yellow by the age of 5 days.  Wetting 6-8 diapers every 24 hours as your baby continues to grow and develop.  At least 3 stools in a 24-hour period by the age of 5 days. The stool should be soft and yellow.  At least 3 stools in a 24-hour period by the age of 7 days. The stool should be seedy and yellow.  No loss of weight greater than 10% of birth weight during the first 3 days of life.  Average weight gain of 4-7 oz (113-198 g) per week after the age of 4 days.  Consistent daily weight gain by the age of 5 days, without weight loss after the age of 2 weeks. After a feeding, your baby may spit up a small amount of milk. This is normal. Breastfeeding frequency and duration Frequent feeding will help you make more milk and can prevent sore nipples and extremely full breasts (breast engorgement). Breastfeed when you feel the need to reduce the fullness of your breasts or when your baby shows signs of hunger. This is called "breastfeeding on demand." Signs that your baby is hungry include:  Increased alertness, activity, or restlessness.  Movement of the head from side to side.  Opening of the mouth when the corner of the mouth or cheek is stroked (rooting).  Increased  sucking sounds, smacking lips, cooing, sighing, or squeaking.  Hand-to-mouth movements and sucking on fingers or hands.  Fussing or crying. Avoid introducing a pacifier to your baby in the first 4-6 weeks after your baby is born. After this time, you may choose to use a pacifier. Research has shown that pacifier use during the first year of a baby's life decreases the risk of sudden infant death syndrome (SIDS). Allow your baby to feed on each breast as long as he or she wants. When your baby unlatches or falls asleep while feeding from the   first breast, offer the second breast. Because newborns are often sleepy in the first few weeks of life, you may need to awaken your baby to get him or her to feed. Breastfeeding times will vary from baby to baby. However, the following rules can serve as a guide to help you make sure that your baby is properly fed:  Newborns (babies 4 weeks of age or younger) may breastfeed every 1-3 hours.  Newborns should not go without breastfeeding for longer than 3 hours during the day or 5 hours during the night.  You should breastfeed your baby a minimum of 8 times in a 24-hour period. Breast milk pumping Pumping and storing breast milk allows you to make sure that your baby is exclusively fed your breast milk, even at times when you are unable to breastfeed. This is especially important if you go back to work while you are still breastfeeding, or if you are not able to be present during feedings. Your lactation consultant can help you find a method of pumping that works best for you and give you guidelines about how long it is safe to store breast milk.      Caring for your breasts while you breastfeed Nipples can become dry, cracked, and sore while breastfeeding. The following recommendations can help keep your breasts moisturized and healthy:  Avoid using soap on your nipples.  Wear a supportive bra designed especially for nursing. Avoid wearing underwire-style  bras or extremely tight bras (sports bras).  Air-dry your nipples for 3-4 minutes after each feeding.  Use only cotton bra pads to absorb leaked breast milk. Leaking of breast milk between feedings is normal.  Use lanolin on your nipples after breastfeeding. Lanolin helps to maintain your skin's normal moisture barrier. Pure lanolin is not harmful (not toxic) to your baby. You may also hand express a few drops of breast milk and gently massage that milk into your nipples and allow the milk to air-dry. In the first few weeks after giving birth, some women experience breast engorgement. Engorgement can make your breasts feel heavy, warm, and tender to the touch. Engorgement peaks within 3-5 days after you give birth. The following recommendations can help to ease engorgement:  Completely empty your breasts while breastfeeding or pumping. You may want to start by applying warm, moist heat (in the shower or with warm, water-soaked hand towels) just before feeding or pumping. This increases circulation and helps the milk flow. If your baby does not completely empty your breasts while breastfeeding, pump any extra milk after he or she is finished.  Apply ice packs to your breasts immediately after breastfeeding or pumping, unless this is too uncomfortable for you. To do this: ? Put ice in a plastic bag. ? Place a towel between your skin and the bag. ? Leave the ice on for 20 minutes, 2-3 times a day.  Make sure that your baby is latched on and positioned properly while breastfeeding. If engorgement persists after 48 hours of following these recommendations, contact your health care provider or a lactation consultant. Overall health care recommendations while breastfeeding  Eat 3 healthy meals and 3 snacks every day. Well-nourished mothers who are breastfeeding need an additional 450-500 calories a day. You can meet this requirement by increasing the amount of a balanced diet that you eat.  Drink  enough water to keep your urine pale yellow or clear.  Rest often, relax, and continue to take your prenatal vitamins to prevent fatigue, stress, and low   vitamin and mineral levels in your body (nutrient deficiencies).  Do not use any products that contain nicotine or tobacco, such as cigarettes and e-cigarettes. Your baby may be harmed by chemicals from cigarettes that pass into breast milk and exposure to secondhand smoke. If you need help quitting, ask your health care provider.  Avoid alcohol.  Do not use illegal drugs or marijuana.  Talk with your health care provider before taking any medicines. These include over-the-counter and prescription medicines as well as vitamins and herbal supplements. Some medicines that may be harmful to your baby can pass through breast milk.  It is possible to become pregnant while breastfeeding. If birth control is desired, ask your health care provider about options that will be safe while breastfeeding your baby. Where to find more information: [employer]: www.llli.org Contact a health care provider if:  You feel like you want to stop breastfeeding or have become frustrated with breastfeeding.  Your nipples are cracked or bleeding.  Your breasts are red, tender, or warm.  You have: ? Painful breasts or nipples. ? A swollen area on either breast. ? A fever or chills. ? Nausea or vomiting. ? Drainage other than breast milk from your nipples.  Your breasts do not become full before feedings by the fifth day after you give birth.  You feel sad and depressed.  Your baby is: ? Too sleepy to eat well. ? Having trouble sleeping. ? More than 1 week old and wetting fewer than 6 diapers in a 24-hour period. ? Not gaining weight by 5 days of age.  Your baby has fewer than 3 stools in a 24-hour period.  Your baby's skin or the white parts of his or her eyes become yellow. Get help right away if:  Your baby is overly tired  (lethargic) and does not want to wake up and feed.  Your baby develops an unexplained fever. Summary  Breastfeeding offers many health benefits for infant and mothers.  Try to breastfeed your infant when he or she shows early signs of hunger.  Gently tickle or stroke your baby's lips with your finger or nipple to allow the baby to open his or her mouth. Bring the baby to your breast. Make sure that much of the areola is in your baby's mouth. Offer one side and burp the baby before you offer the other side.  Talk with your health care provider or lactation consultant if you have questions or you face problems as you breastfeed. This information is not intended to replace advice given to you by your health care provider. Make sure you discuss any questions you have with your health care provider. Document Revised: 05/31/2017 Document Reviewed: 04/07/2016 Elsevier Patient Education  2021 [employer]  

## 2020-07-13 NOTE — Progress Notes (Signed)
Subjective:   Beverly Chapman is a 30 y.o. [phone] at [redacted]w[redacted]d by LMP c/w 19wk [place] being seen today for her first obstetrical visit.  Her obstetrical history is significant for obesity and history of preterm delivery. Patient does intend to breast feed. Pregnancy history fully reviewed.  Patient reports headaches throughout pregnancy which she describes as migraines. Primarily on R side of head. No vision changes, weakness, numbness, tingling. Has tried tylenol without good effect.   Also interested in BTL. Is sure she does not want more children. Aware of other LARC options.   HISTORY: OB History  Gravida Para Term Preterm AB Living  4 3 2 1  0 3  SAB IAB Ectopic Multiple Live Births  0 0 0 0 3    # Outcome Date GA Lbr Len/2nd Weight Sex Delivery Anes PTL Lv  4 Current           3 Term 03/27/18 [redacted]w[redacted]d 11:39 / 00:21 6 lb 11.9 oz (3.059 kg) M Vag-Spont EPI  LIV     Name: Agnes,BOY Tamrah     Apgar1: 9  Apgar5: 9  2 Preterm 03/23/08 [redacted]w[redacted]d  5 lb 2 oz (2.325 kg) F Vag-Spont  Y   1 Term 01/08/00 [redacted]w[redacted]d  6 lb 14 oz (3.118 kg) F Vag-Spont  N LIV   Last pap smear was  05/28/2015 and was normal Past Medical History:  Diagnosis Date  . Preterm labor    Past Surgical History:  Procedure Laterality Date  . NO PAST SURGERIES     Family History  Problem Relation Age of Onset  . ADD / ADHD Neg Hx   . Alcohol abuse Neg Hx   . Anxiety disorder Neg Hx   . Arthritis Neg Hx   . Asthma Neg Hx    Social History   Tobacco Use  . Smoking status: Never Smoker  . Smokeless tobacco: Never Used  Vaping Use  . Vaping Use: Never used  Substance Use Topics  . Alcohol use: Not Currently    Comment: ocassionally  . Drug use: No   No Known Allergies Current Outpatient Medications on File Prior to Visit  Medication Sig Dispense Refill  . acetaminophen (TYLENOL) 325 MG tablet Take 650 mg by mouth every 6 (six) hours as needed.    . Prenatal Vit-Fe Fumarate-FA (PRENATAL COMPLETE) 14-0.4 MG TABS  Take 1 tablet by mouth daily. 60 each 0  . benzocaine-Menthol (DERMOPLAST) 20-0.5 % AERO Apply 1 application topically as needed for irritation (perineal discomfort). (Patient not taking: No sig reported)    . ibuprofen (ADVIL,MOTRIN) 600 MG tablet Take 1 tablet (600 mg total) by mouth every 6 (six) hours as needed. (Patient not taking: No sig reported) 30 tablet 0  . ranitidine (ZANTAC) 150 MG tablet Take 1 tablet (150 mg total) by mouth 2 (two) times daily. (Patient not taking: No sig reported)     No current facility-administered medications on file prior to visit.     Exam   Vitals:   07/13/20 1502  BP: 117/70  Pulse: 87  Weight: 203 lb 14.4 oz (92.5 kg)   Fetal Heart Rate (bpm): 147  Uterus:     Pelvic Exam: Perineum: no hemorrhoids, normal perineum   Vulva: normal external genitalia, no lesions   Vagina:  normal mucosa, normal discharge   Cervix: no lesions and normal, pap smear done.    Adnexa: normal adnexa and no mass, fullness, tenderness   Bony Pelvis: average  System:  General: well-developed, well-nourished female in no acute distress   Breast:  normal appearance, no masses or tenderness   Skin: normal coloration and turgor, no rashes   Neurologic: oriented, normal, negative, normal mood   Extremities: normal strength, tone, and muscle mass, ROM of all joints is normal   HEENT PERRLA, extraocular movement intact and sclera clear, anicteric   Mouth/Teeth mucous membranes moist, pharynx normal without lesions and dental hygiene good   Neck supple and no masses   Cardiovascular: regular rate and rhythm   Respiratory:  no respiratory distress, normal breath sounds   Abdomen: soft, non-tender; bowel sounds normal; no masses,  no organomegaly     Assessment:   Pregnancy: [id number] Patient Active Problem List   Diagnosis Date Noted  . Supervision of high risk pregnancy, antepartum 07/13/2020  . Hx of migraines 07/13/2020  . Sialorrhea 07/13/2020  . SVD (spontaneous  vaginal delivery) 03/27/2018  . Gingivitis 02/13/2018  . History of preterm delivery, currently pregnant 10/18/2017     Plan:  1. Supervision of high risk pregnancy, antepartum Initial labs drawn. Will need PP pap Reviewed BTL, she highly desires this, sign papers next visit Needs TDaP and 28wk labs at next visit, discussed need to come to visit fasting Continue prenatal vitamins. Genetic Screening discussed, Materni21: results reviewed. Ultrasound discussed; fetal anatomic survey: results reviewed. Problem list reviewed and updated. The nature of [facility] with multiple MDs and other Advanced Practice Providers was explained to patient; also emphasized that residents, students are part of our team.  2. Hx of migraines BP normal, no red flags Trial flexeril Consider neurology referral if no improvement  3. Sialorrhea Significant symptoms, using emesis bag to spit throughout visit Trial of glycopyrrolate not tolerated After reviewing second line options will trial nasal iptratropium taken sublingually   4. History of preterm delivery, currently pregnant Second pregnancy delivered at 36 weeks First and third deliveries both term Too late for [facility]   Routine obstetric precautions reviewed. Return in 2 weeks (on 07/27/2020) for [facility], ob visit, 28 wk labs.

## 2020-07-14 LAB — CBC/D/PLT+RPR+RH+ABO+RUB AB...
Antibody Screen: NEGATIVE
Basophils Absolute: 0.1 10*3/uL (ref 0.0–0.2)
Basos: 1 %
EOS (ABSOLUTE): 0.2 10*3/uL (ref 0.0–0.4)
Eos: 2 %
HCV Ab: <0.1 s/co ratio (ref 0.0–0.9)
HIV Screen 4th Generation wRfx: NONREACTIVE
Hematocrit: 35.3 % (ref 34.0–46.6)
Hemoglobin: 11.3 g/dL (ref 11.1–15.9)
Hepatitis B Surface Ag: NEGATIVE
Immature Grans (Abs): 0.1 10*3/uL (ref 0.0–0.1)
Immature Granulocytes: 1 %
Lymphocytes Absolute: 1.5 10*3/uL (ref 0.7–3.1)
Lymphs: 18 %
MCH: 26.1 pg — ABNORMAL LOW (ref 26.6–33.0)
MCHC: 32 g/dL (ref 31.5–35.7)
MCV: 82 fL (ref 79–97)
Monocytes Absolute: 0.9 10*3/uL (ref 0.1–0.9)
Monocytes: 11 %
Neutrophils Absolute: 5.9 10*3/uL (ref 1.4–7.0)
Neutrophils: 67 %
Platelets: 232 10*3/uL (ref 150–450)
RBC: 4.33 x10E6/uL (ref 3.77–5.28)
RDW: 13.9 % (ref 11.7–15.4)
RPR Ser Ql: NONREACTIVE
Rh Factor: POSITIVE
Rubella Antibodies, IGG: 2.37 index (ref >0.99)
WBC: 8.5 10*3/uL (ref 3.4–10.8)

## 2020-07-14 LAB — GC/CHLAMYDIA PROBE AMP (~~LOC~~) NOT AT ARMC
Chlamydia: NEGATIVE
Comment: NEGATIVE
Comment: NORMAL
Neisseria Gonorrhea: NEGATIVE

## 2020-07-14 LAB — HEMOGLOBIN A1C
Est. average glucose Bld gHb Est-mCnc: 100 mg/dL
Hgb A1c MFr Bld: 5.1 % (ref 4.8–5.6)

## 2020-07-14 LAB — HCV INTERPRETATION

## 2020-07-15 LAB — URINE CULTURE, OB REFLEX

## 2020-07-15 LAB — CULTURE, OB URINE

## 2020-07-30 ENCOUNTER — Encounter: Payer: Medicaid Other | Admitting: Nurse Practitioner

## 2020-08-25 ENCOUNTER — Ambulatory Visit: Payer: Medicaid Other | Admitting: *Deleted

## 2020-08-25 ENCOUNTER — Other Ambulatory Visit: Payer: Self-pay | Admitting: *Deleted

## 2020-08-25 ENCOUNTER — Other Ambulatory Visit: Payer: Self-pay

## 2020-08-25 ENCOUNTER — Ambulatory Visit: Payer: Medicaid Other | Attending: Obstetrics and Gynecology

## 2020-08-25 ENCOUNTER — Encounter: Payer: Self-pay | Admitting: *Deleted

## 2020-08-25 DIAGNOSIS — O09213 Supervision of pregnancy with history of pre-term labor, third trimester: Secondary | ICD-10-CM

## 2020-08-25 DIAGNOSIS — E669 Obesity, unspecified: Secondary | ICD-10-CM

## 2020-08-25 DIAGNOSIS — R6252 Short stature (child): Secondary | ICD-10-CM | POA: Insufficient documentation

## 2020-08-25 DIAGNOSIS — Z362 Encounter for other antenatal screening follow-up: Secondary | ICD-10-CM | POA: Diagnosis not present

## 2020-08-25 DIAGNOSIS — Z3A33 33 weeks gestation of pregnancy: Secondary | ICD-10-CM

## 2020-08-25 DIAGNOSIS — F1910 Other psychoactive substance abuse, uncomplicated: Secondary | ICD-10-CM

## 2020-08-25 DIAGNOSIS — O0990 Supervision of high risk pregnancy, unspecified, unspecified trimester: Secondary | ICD-10-CM | POA: Insufficient documentation

## 2020-08-25 DIAGNOSIS — O99323 Drug use complicating pregnancy, third trimester: Secondary | ICD-10-CM | POA: Diagnosis not present

## 2020-08-25 DIAGNOSIS — R638 Other symptoms and signs concerning food and fluid intake: Secondary | ICD-10-CM

## 2020-08-25 DIAGNOSIS — O99213 Obesity complicating pregnancy, third trimester: Secondary | ICD-10-CM

## 2020-09-21 ENCOUNTER — Ambulatory Visit (INDEPENDENT_AMBULATORY_CARE_PROVIDER_SITE_OTHER): Payer: Medicaid Other | Admitting: Family Medicine

## 2020-09-21 ENCOUNTER — Other Ambulatory Visit (HOSPITAL_COMMUNITY)
Admission: RE | Admit: 2020-09-21 | Discharge: 2020-09-21 | Disposition: A | Discharge status: Home / Self Care | Payer: Medicaid Other | Source: Ambulatory Visit | Attending: Family Medicine | Admitting: Family Medicine

## 2020-09-21 ENCOUNTER — Other Ambulatory Visit: Payer: Self-pay

## 2020-09-21 ENCOUNTER — Other Ambulatory Visit: Payer: Medicaid Other

## 2020-09-21 ENCOUNTER — Encounter: Payer: Self-pay | Admitting: Family Medicine

## 2020-09-21 ENCOUNTER — Other Ambulatory Visit: Payer: Self-pay | Admitting: General Practice

## 2020-09-21 VITALS — BP 116/76 | HR 80 | Wt 214.7 lb

## 2020-09-21 DIAGNOSIS — O09899 Supervision of other high risk pregnancies, unspecified trimester: Secondary | ICD-10-CM | POA: Diagnosis not present

## 2020-09-21 DIAGNOSIS — O0990 Supervision of high risk pregnancy, unspecified, unspecified trimester: Secondary | ICD-10-CM

## 2020-09-21 DIAGNOSIS — K117 Disturbances of salivary secretion: Secondary | ICD-10-CM

## 2020-09-21 DIAGNOSIS — Z8669 Personal history of other diseases of the nervous system and sense organs: Secondary | ICD-10-CM | POA: Diagnosis not present

## 2020-09-21 DIAGNOSIS — Z23 Encounter for immunization: Secondary | ICD-10-CM

## 2020-09-21 NOTE — Progress Notes (Signed)
   Subjective:  Beverly Chapman is a 30 y.o. [phone] at [redacted]w[redacted]d being seen today for ongoing prenatal care.  She is currently monitored for the following issues for this high-risk pregnancy and has History of preterm delivery, currently pregnant; Gingivitis; SVD (spontaneous vaginal delivery); Supervision of high risk pregnancy, antepartum; Hx of migraines; and Sialorrhea on their problem list.  Patient reports  pelvic pressure .  Contractions: Not present. Vag. Bleeding: None.  Movement: Present. Denies leaking of fluid.   The following portions of the patient's history were reviewed and updated as appropriate: allergies, current medications, past family history, past medical history, past social history, past surgical history and problem list. Problem list updated.  Objective:   Vitals:   09/21/20 1025  BP: 116/76  Pulse: 80  Weight: 214 lb 11.2 oz (97.4 kg)    Fetal Status: Fetal Heart Rate (bpm): 134   Movement: Present     General:  Alert, oriented and cooperative. Patient is in no acute distress.  Skin: Skin is warm and dry. No rash noted.   Cardiovascular: Normal heart rate noted  Respiratory: Normal respiratory effort, no problems with respiration noted  Abdomen: Soft, gravid, appropriate for gestational age. Pain/Pressure: Present     Pelvic: Vag. Bleeding: None     Cervical exam deferred        Extremities: Normal range of motion.  Edema: None  Mental Status: Normal mood and affect. Normal behavior. Normal judgment and thought content.   Urinalysis:      Assessment and Plan:  Pregnancy: [id number] at [redacted]w[redacted]d  1. Supervision of high risk pregnancy, antepartum BP and FHR normal 28wk labs done today Swabs today TDaP offered, accepts Desires BTL, papers signed today but aware she is unlikely to be able to have it due to signing late and will likely need interval BTL  2. History of preterm delivery, currently pregnant 2nd pregnancy 36 wks  3. Hx of migraines No headaches  reently  4. Sialorrhea Sublingual ipratropium was not effective Unfortunately lacking in options for treatment at present  Preterm labor symptoms and general obstetric precautions including but not limited to vaginal bleeding, contractions, leaking of fluid and fetal movement were reviewed in detail with the patient. Please refer to After Visit Summary for other counseling recommendations.  Return in 1 week (on 09/28/2020).   Venora Maples, MD

## 2020-09-21 NOTE — Patient Instructions (Signed)

## 2020-09-22 ENCOUNTER — Other Ambulatory Visit: Payer: Self-pay | Admitting: Family Medicine

## 2020-09-22 DIAGNOSIS — O99019 Anemia complicating pregnancy, unspecified trimester: Secondary | ICD-10-CM | POA: Insufficient documentation

## 2020-09-22 DIAGNOSIS — O24410 Gestational diabetes mellitus in pregnancy, diet controlled: Secondary | ICD-10-CM

## 2020-09-22 DIAGNOSIS — O99013 Anemia complicating pregnancy, third trimester: Secondary | ICD-10-CM

## 2020-09-22 DIAGNOSIS — O24419 Gestational diabetes mellitus in pregnancy, unspecified control: Secondary | ICD-10-CM | POA: Insufficient documentation

## 2020-09-22 LAB — HIV ANTIBODY (ROUTINE TESTING W REFLEX): HIV Screen 4th Generation wRfx: NONREACTIVE

## 2020-09-22 LAB — CBC
Hematocrit: 34.2 % (ref 34.0–46.6)
Hemoglobin: 10.4 g/dL — ABNORMAL LOW (ref 11.1–15.9)
MCH: 23.5 pg — ABNORMAL LOW (ref 26.6–33.0)
MCHC: 30.4 g/dL — ABNORMAL LOW (ref 31.5–35.7)
MCV: 77 fL — ABNORMAL LOW (ref 79–97)
Platelets: 228 10*3/uL (ref 150–450)
RBC: 4.43 x10E6/uL (ref 3.77–5.28)
RDW: 15.8 % — ABNORMAL HIGH (ref 11.7–15.4)
WBC: 9.1 10*3/uL (ref 3.4–10.8)

## 2020-09-22 LAB — CERVICOVAGINAL ANCILLARY ONLY
Chlamydia: NEGATIVE
Comment: NEGATIVE
Comment: NORMAL
Neisseria Gonorrhea: NEGATIVE

## 2020-09-22 LAB — RPR: RPR Ser Ql: NONREACTIVE

## 2020-09-22 LAB — GLUCOSE TOLERANCE, 2 HOURS W/ 1HR
Glucose, 1 hour: 86 mg/dL (ref 65–179)
Glucose, 2 hour: 92 mg/dL (ref 65–152)
Glucose, Fasting: 99 mg/dL — ABNORMAL HIGH (ref 65–91)

## 2020-09-22 MED ORDER — ACCU-CHEK GUIDE W/DEVICE KIT: 1.0000 | PACK | Freq: Four times a day (QID) | 0 refills | Status: DC

## 2020-09-22 MED ORDER — ACCU-CHEK GUIDE VI STRP: ORAL_STRIP | 12 refills | Status: DC

## 2020-09-22 MED ORDER — ACCU-CHEK SOFTCLIX LANCETS MISC: 12 refills | Status: DC

## 2020-09-22 MED ORDER — FERROUS SULFATE 325 (65 FE) MG PO TBEC: 325.0000 mg | DELAYED_RELEASE_TABLET | ORAL | 2 refills | Status: DC

## 2020-09-23 ENCOUNTER — Other Ambulatory Visit: Payer: Self-pay | Admitting: Obstetrics and Gynecology

## 2020-09-23 ENCOUNTER — Other Ambulatory Visit: Payer: Self-pay

## 2020-09-23 ENCOUNTER — Ambulatory Visit: Payer: Medicaid Other | Attending: Obstetrics and Gynecology

## 2020-09-23 ENCOUNTER — Ambulatory Visit: Payer: Medicaid Other | Admitting: *Deleted

## 2020-09-23 ENCOUNTER — Encounter: Payer: Self-pay | Admitting: *Deleted

## 2020-09-23 VITALS — BP 116/62 | HR 87

## 2020-09-23 DIAGNOSIS — O99320 Drug use complicating pregnancy, unspecified trimester: Secondary | ICD-10-CM | POA: Diagnosis not present

## 2020-09-23 DIAGNOSIS — Z3A37 37 weeks gestation of pregnancy: Secondary | ICD-10-CM | POA: Insufficient documentation

## 2020-09-23 DIAGNOSIS — O09213 Supervision of pregnancy with history of pre-term labor, third trimester: Secondary | ICD-10-CM

## 2020-09-23 DIAGNOSIS — O0990 Supervision of high risk pregnancy, unspecified, unspecified trimester: Secondary | ICD-10-CM | POA: Diagnosis present

## 2020-09-23 DIAGNOSIS — F1920 Other psychoactive substance dependence, uncomplicated: Secondary | ICD-10-CM

## 2020-09-23 DIAGNOSIS — O99213 Obesity complicating pregnancy, third trimester: Secondary | ICD-10-CM | POA: Diagnosis not present

## 2020-09-25 LAB — CULTURE, BETA STREP (GROUP B ONLY): Strep Gp B Culture: NEGATIVE

## 2020-09-29 ENCOUNTER — Other Ambulatory Visit: Payer: Self-pay | Admitting: Advanced Practice Midwife

## 2020-09-29 ENCOUNTER — Ambulatory Visit (INDEPENDENT_AMBULATORY_CARE_PROVIDER_SITE_OTHER): Payer: Medicaid Other | Admitting: Obstetrics and Gynecology

## 2020-09-29 ENCOUNTER — Encounter: Payer: Self-pay | Admitting: Obstetrics and Gynecology

## 2020-09-29 ENCOUNTER — Other Ambulatory Visit: Payer: Self-pay

## 2020-09-29 VITALS — BP 112/73 | HR 86 | Wt 215.3 lb

## 2020-09-29 DIAGNOSIS — O24410 Gestational diabetes mellitus in pregnancy, diet controlled: Secondary | ICD-10-CM

## 2020-09-29 DIAGNOSIS — K117 Disturbances of salivary secretion: Secondary | ICD-10-CM

## 2020-09-29 DIAGNOSIS — O0990 Supervision of high risk pregnancy, unspecified, unspecified trimester: Secondary | ICD-10-CM

## 2020-09-29 DIAGNOSIS — O99013 Anemia complicating pregnancy, third trimester: Secondary | ICD-10-CM

## 2020-09-29 DIAGNOSIS — O09899 Supervision of other high risk pregnancies, unspecified trimester: Secondary | ICD-10-CM

## 2020-09-29 DIAGNOSIS — Z3009 Encounter for other general counseling and advice on contraception: Secondary | ICD-10-CM

## 2020-09-29 NOTE — Progress Notes (Addendum)
Subjective:  Beverly Chapman is a 30 y.o. [phone] at [redacted]w[redacted]d being seen today for ongoing prenatal care.  She is currently monitored for the following issues for this high-risk pregnancy and has History of preterm delivery, currently pregnant; Gingivitis; Supervision of high risk pregnancy, antepartum; Hx of migraines; Sialorrhea; Gestational diabetes mellitus (GDM); Anemia of pregnancy; and Unwanted fertility on their problem list.  Patient reports  general discomforts of pregnancy .  Contractions: Not present. Vag. Bleeding: None.  Movement: Present. Denies leaking of fluid.   The following portions of the patient's history were reviewed and updated as appropriate: allergies, current medications, past family history, past medical history, past social history, past surgical history and problem list. Problem list updated.  Objective:   Vitals:   09/29/20 1452  Weight: 215 lb 4.8 oz (97.7 kg)    Fetal Status:     Movement: Present     General:  Alert, oriented and cooperative. Patient is in no acute distress.  Skin: Skin is warm and dry. No rash noted.   Cardiovascular: Normal heart rate noted  Respiratory: Normal respiratory effort, no problems with respiration noted  Abdomen: Soft, gravid, appropriate for gestational age. Pain/Pressure: Absent     Pelvic:  Cervical exam performed        Extremities: Normal range of motion.  Edema: None  Mental Status: Normal mood and affect. Normal behavior. Normal judgment and thought content.   Urinalysis:      Assessment and Plan:  Pregnancy: [id number] at [redacted]w[redacted]d  1. Supervision of high risk pregnancy, antepartum Stable Labor precautions  2. Diet controlled gestational diabetes mellitus (GDM) in third trimester Pt not checking CBG's Has not picked up supplies or seen DM educator Importance reviewed with pt U/S 36 % on 7/7 IOL scheduled as per MFM NST this Friday  3. History of preterm delivery, currently pregnant Stable  4.  Sialorrhea Stable  5. Anemia during pregnancy in third trimester Stable  6. Unwanted fertility BTL papers signed 09/21/20  Term labor symptoms and general obstetric precautions including but not limited to vaginal bleeding, contractions, leaking of fluid and fetal movement were reviewed in detail with the patient. Please refer to After Visit Summary for other counseling recommendations.  No follow-ups on file.   Hermina Staggers, MD

## 2020-09-29 NOTE — Patient Instructions (Signed)
Vaginal Delivery ?Vaginal delivery means that you give birth by pushing your baby out of your birth canal (vagina). Your health care team will help you before, during, and after vaginal delivery. ?Birth experiences are unique for every woman and every pregnancy, and birth experiences vary depending on where you choose to give birth. ?What are the risks and benefits? ?Generally, this is safe. However, problems may occur, including: ?Bleeding. ?Infection. ?Damage to other structures such as vaginal tearing. ?Allergic reactions to medicines. ?Despite the risks, benefits of vaginal delivery include less risk of bleeding and infection and a shorter recovery time compared to a Cesarean delivery. Cesarean delivery, or C-section, is the surgical delivery of a baby. ?What happens when I arrive at the birth center or hospital? ?Once you are in labor and have been admitted into the hospital or birth center, your health care team may: ?Review your pregnancy history and any concerns that you have. ?Talk with you about your birth plan and discuss pain control options. ?Check your blood pressure, breathing, and heartbeat. ?Assess your baby's heartbeat. ?Monitor your uterus for contractions. ?Check whether your bag of water (amniotic sac) has broken (ruptured). ?Insert an IV into one of your veins. This may be used to give you fluids and medicines. ?Monitoring ?Your health care team may assess your contractions (uterine monitoring) and your baby's heart rate (fetal monitoring). You may need to be monitored: ?Often, but not continuously (intermittently). ?All the time or for long periods at a time (continuously). Continuous monitoring may be needed if: ?You are taking certain medicines, such as medicine to relieve pain or make your contractions stronger. ?You have pregnancy or labor complications. ?Monitoring may be done by: ?Placing a special stethoscope or a handheld monitoring device on your abdomen to check your baby's heartbeat  and to check for contractions. ?Placing monitors on your abdomen (external monitors) to record your baby's heartbeat and the frequency and length of contractions. ?Placing monitors inside your uterus through your vagina (internal monitors) to record your baby's heartbeat and the frequency, length, and strength of your contractions. Depending on the type of monitor, it may remain in your uterus or on your baby's head until birth. ?Telemetry. This is a type of continuous monitoring that can be done with external or internal monitors. Instead of having to stay in bed, you are able to move around. ?Physical exam ?Your health care team may perform frequent physical exams. This may include: ?Checking how and where your baby is positioned in your uterus. ?Checking your cervix to determine: ?Whether it is thinning out (effacing). ?Whether it is opening up (dilating). ?What happens during labor and delivery? ?Normal labor and delivery is divided into the following three stages: ?Stage 1 ?This is the longest stage of labor. ?Throughout this stage, you will feel contractions. Contractions generally feel mild, infrequent, and irregular at first. They get stronger, more frequent, and more regular as you move through this stage. You may have contractions about every 2-3 minutes. ?This stage ends when your cervix is completely dilated to 4 inches (10 cm) and completely effaced. ?Stage 2 ?This stage starts once your cervix is completely effaced and dilated and lasts until the delivery of your baby. ?This is the stage where you will feel an urge to push your baby out of your vagina. ?You may feel stretching and burning pain, especially when the widest part of your baby's head passes through the vaginal opening (crowning). ?Once your baby is delivered, the umbilical cord will be clamped and   cut. Timing of cutting the cord will depend on your wishes, your baby's health, and your health care provider's practices. ?Your baby will be  placed on your bare chest (skin-to-skin contact) in an upright position and covered with a warm blanket. If you are choosing to breastfeed, watch your baby for feeding cues, like rooting or sucking, and help the baby to your breast for his or her first feeding. ?Stage 3 ?This stage starts immediately after the birth of your baby and ends after you deliver the placenta. ?This stage may take anywhere from 5 to 30 minutes. ?After your baby has been delivered, you will feel contractions as your body expels the placenta. These contractions also help your uterus get smaller and reduce bleeding. ?What can I expect after labor and delivery? ?After labor is over, you and your baby will be assessed closely until you are ready to go home. Your health care team will teach you how to care for yourself and your baby. ?You and your baby may be encouraged to stay in the same room (rooming in) during your hospital stay. This will help promote early bonding and successful breastfeeding. ?Your uterus will be checked and massaged regularly (fundal massage). ?You may continue to receive fluids and medicines through an IV. ?You will have some soreness and pain in your abdomen, vagina, and the area of skin between your vaginal opening and your anus (perineum). ?If an incision was made near your vagina (episiotomy) or if you had some vaginal tearing during delivery, cold compresses may be placed on your episiotomy or your tear. This helps to reduce pain and swelling. ?It is normal to have vaginal bleeding after delivery. Wear a sanitary pad for vaginal bleeding and discharge. ?Summary ?Vaginal delivery means that you will give birth by pushing your baby out of your birth canal (vagina). ?Your health care team will monitor you and your baby throughout the stages of labor. ?After you deliver your baby, your health care team will continue to assess you and your baby to ensure you are both recovering as expected after delivery. ?This  information is not intended to replace advice given to you by your health care provider. Make sure you discuss any questions you have with your health care provider. ?Document Revised: 02/02/2020 Document Reviewed: 02/02/2020 ?Elsevier Patient Education ? 2022 [employer] ? ?

## 2020-09-30 ENCOUNTER — Telehealth (HOSPITAL_COMMUNITY): Payer: Self-pay | Admitting: *Deleted

## 2020-09-30 ENCOUNTER — Encounter (HOSPITAL_COMMUNITY): Payer: Self-pay | Admitting: *Deleted

## 2020-09-30 NOTE — Telephone Encounter (Signed)
Preadmission screen  

## 2020-10-01 ENCOUNTER — Ambulatory Visit (INDEPENDENT_AMBULATORY_CARE_PROVIDER_SITE_OTHER): Payer: Self-pay | Admitting: *Deleted

## 2020-10-01 ENCOUNTER — Ambulatory Visit (INDEPENDENT_AMBULATORY_CARE_PROVIDER_SITE_OTHER): Payer: Medicaid Other

## 2020-10-01 ENCOUNTER — Other Ambulatory Visit: Payer: Self-pay

## 2020-10-01 VITALS — BP 108/66 | HR 76 | Wt 213.1 lb

## 2020-10-01 DIAGNOSIS — Z3A38 38 weeks gestation of pregnancy: Secondary | ICD-10-CM

## 2020-10-01 DIAGNOSIS — O24410 Gestational diabetes mellitus in pregnancy, diet controlled: Secondary | ICD-10-CM | POA: Diagnosis not present

## 2020-10-01 NOTE — Progress Notes (Signed)
Pt informed that the ultrasound is considered a limited OB ultrasound and is not intended to be a complete ultrasound exam.  Patient also informed that the ultrasound is not being completed with the intent of assessing for fetal or placental anomalies or any pelvic abnormalities.  Explained that the purpose of today's ultrasound is to assess for presentation, BPP and amniotic fluid volume.  Patient acknowledges the purpose of the exam and the limitations of the study.    IOL scheduled on 7/19

## 2020-10-04 ENCOUNTER — Other Ambulatory Visit (HOSPITAL_COMMUNITY): Payer: Medicaid Other | Attending: Obstetrics & Gynecology

## 2020-10-05 ENCOUNTER — Other Ambulatory Visit: Payer: Self-pay

## 2020-10-05 ENCOUNTER — Inpatient Hospital Stay (HOSPITAL_COMMUNITY): Payer: Medicaid Other | Admitting: Anesthesiology

## 2020-10-05 ENCOUNTER — Inpatient Hospital Stay (HOSPITAL_COMMUNITY)
Admission: AD | Admit: 2020-10-05 | Discharge: 2020-10-06 | DRG: 807 | Disposition: A | Discharge status: Home / Self Care | Payer: Medicaid Other | Attending: Family Medicine | Admitting: Family Medicine

## 2020-10-05 ENCOUNTER — Encounter (HOSPITAL_COMMUNITY): Payer: Self-pay | Admitting: Obstetrics and Gynecology

## 2020-10-05 ENCOUNTER — Inpatient Hospital Stay (HOSPITAL_COMMUNITY): Payer: Medicaid Other

## 2020-10-05 DIAGNOSIS — O99019 Anemia complicating pregnancy, unspecified trimester: Secondary | ICD-10-CM | POA: Diagnosis present

## 2020-10-05 DIAGNOSIS — O0990 Supervision of high risk pregnancy, unspecified, unspecified trimester: Secondary | ICD-10-CM

## 2020-10-05 DIAGNOSIS — O09899 Supervision of other high risk pregnancies, unspecified trimester: Secondary | ICD-10-CM

## 2020-10-05 DIAGNOSIS — O24419 Gestational diabetes mellitus in pregnancy, unspecified control: Secondary | ICD-10-CM | POA: Diagnosis present

## 2020-10-05 DIAGNOSIS — Z3009 Encounter for other general counseling and advice on contraception: Secondary | ICD-10-CM | POA: Diagnosis present

## 2020-10-05 DIAGNOSIS — Z20822 Contact with and (suspected) exposure to covid-19: Secondary | ICD-10-CM | POA: Diagnosis present

## 2020-10-05 DIAGNOSIS — Z3A38 38 weeks gestation of pregnancy: Secondary | ICD-10-CM | POA: Diagnosis not present

## 2020-10-05 DIAGNOSIS — D649 Anemia, unspecified: Secondary | ICD-10-CM | POA: Diagnosis not present

## 2020-10-05 DIAGNOSIS — O24420 Gestational diabetes mellitus in childbirth, diet controlled: Principal | ICD-10-CM | POA: Diagnosis present

## 2020-10-05 DIAGNOSIS — O9902 Anemia complicating childbirth: Secondary | ICD-10-CM | POA: Diagnosis present

## 2020-10-05 DIAGNOSIS — O24429 Gestational diabetes mellitus in childbirth, unspecified control: Secondary | ICD-10-CM | POA: Diagnosis not present

## 2020-10-05 LAB — TYPE AND SCREEN
ABO/RH(D): A POS
Antibody Screen: NEGATIVE

## 2020-10-05 LAB — CBC
HCT: 34.1 % — ABNORMAL LOW (ref 36.0–46.0)
Hemoglobin: 10.7 g/dL — ABNORMAL LOW (ref 12.0–15.0)
MCH: 23.8 pg — ABNORMAL LOW (ref 26.0–34.0)
MCHC: 31.4 g/dL (ref 30.0–36.0)
MCV: 75.9 fL — ABNORMAL LOW (ref 80.0–100.0)
Platelets: 219 10*3/uL (ref 150–400)
RBC: 4.49 MIL/uL (ref 3.87–5.11)
RDW: 17 % — ABNORMAL HIGH (ref 11.5–15.5)
WBC: 8.9 10*3/uL (ref 4.0–10.5)
nRBC: 0 % (ref 0.0–0.2)

## 2020-10-05 LAB — RESP PANEL BY RT-PCR (FLU A&B, COVID) ARPGX2
Influenza A by PCR: NEGATIVE
Influenza B by PCR: NEGATIVE
SARS Coronavirus 2 by RT PCR: NEGATIVE

## 2020-10-05 LAB — GLUCOSE, CAPILLARY
Glucose-Capillary: 71 mg/dL (ref 70–99)
Glucose-Capillary: 93 mg/dL (ref 70–99)

## 2020-10-05 LAB — RPR: RPR Ser Ql: NONREACTIVE

## 2020-10-05 MED ORDER — FENTANYL CITRATE (PF) 100 MCG/2ML IJ SOLN: 50.0000 ug | INTRAMUSCULAR | Status: DC | PRN

## 2020-10-05 MED ORDER — DIPHENHYDRAMINE HCL 25 MG PO CAPS: 25.0000 mg | ORAL_CAPSULE | Freq: Four times a day (QID) | ORAL | Status: DC | PRN

## 2020-10-05 MED ORDER — PRENATAL MULTIVITAMIN CH
1.0000 | ORAL_TABLET | Freq: Every day | ORAL | Status: DC
  Administered 2020-10-06: 1 via ORAL
  Filled 2020-10-05: qty 1

## 2020-10-05 MED ORDER — SENNOSIDES-DOCUSATE SODIUM 8.6-50 MG PO TABS
2.0000 | ORAL_TABLET | ORAL | Status: DC
  Administered 2020-10-06: 2 via ORAL
  Filled 2020-10-05: qty 2

## 2020-10-05 MED ORDER — LACTATED RINGERS IV SOLN: INTRAVENOUS | Status: DC

## 2020-10-05 MED ORDER — WITCH HAZEL-GLYCERIN EX PADS: 1.0000 "application " | MEDICATED_PAD | CUTANEOUS | Status: DC | PRN

## 2020-10-05 MED ORDER — OXYTOCIN-SODIUM CHLORIDE 30-0.9 UT/500ML-% IV SOLN: 2.5000 [IU]/h | INTRAVENOUS | Status: DC

## 2020-10-05 MED ORDER — OXYTOCIN BOLUS FROM INFUSION
333.0000 mL | Freq: Once | INTRAVENOUS | Status: AC
  Administered 2020-10-05: 333 mL via INTRAVENOUS

## 2020-10-05 MED ORDER — ONDANSETRON HCL 4 MG/2ML IJ SOLN: 4.0000 mg | INTRAMUSCULAR | Status: DC | PRN

## 2020-10-05 MED ORDER — ACETAMINOPHEN 500 MG PO TABS
1000.0000 mg | ORAL_TABLET | Freq: Three times a day (TID) | ORAL | Status: DC
  Administered 2020-10-05 – 2020-10-06 (×4): 1000 mg via ORAL
  Filled 2020-10-05 (×4): qty 2

## 2020-10-05 MED ORDER — DOCUSATE SODIUM 100 MG PO CAPS
100.0000 mg | ORAL_CAPSULE | Freq: Two times a day (BID) | ORAL | Status: DC
  Administered 2020-10-05 – 2020-10-06 (×2): 100 mg via ORAL
  Filled 2020-10-05 (×2): qty 1

## 2020-10-05 MED ORDER — TETANUS-DIPHTH-ACELL PERTUSSIS 5-2.5-18.5 LF-MCG/0.5 IM SUSY: 0.5000 mL | PREFILLED_SYRINGE | Freq: Once | INTRAMUSCULAR | Status: DC

## 2020-10-05 MED ORDER — EPHEDRINE 5 MG/ML INJ: 10.0000 mg | INTRAVENOUS | Status: DC | PRN

## 2020-10-05 MED ORDER — LACTATED RINGERS IV SOLN: 500.0000 mL | INTRAVENOUS | Status: DC | PRN

## 2020-10-05 MED ORDER — OXYTOCIN-SODIUM CHLORIDE 30-0.9 UT/500ML-% IV SOLN
1.0000 m[IU]/min | INTRAVENOUS | Status: DC
  Administered 2020-10-05: 2 m[IU]/min via INTRAVENOUS
  Filled 2020-10-05: qty 500

## 2020-10-05 MED ORDER — MEASLES, MUMPS & RUBELLA VAC IJ SOLR: 0.5000 mL | Freq: Once | INTRAMUSCULAR | Status: DC

## 2020-10-05 MED ORDER — MEDROXYPROGESTERONE ACETATE 150 MG/ML IM SUSP
150.0000 mg | INTRAMUSCULAR | Status: AC | PRN
  Administered 2020-10-06: 150 mg via INTRAMUSCULAR
  Filled 2020-10-05: qty 1

## 2020-10-05 MED ORDER — ONDANSETRON HCL 4 MG/2ML IJ SOLN: 4.0000 mg | Freq: Four times a day (QID) | INTRAMUSCULAR | Status: DC | PRN

## 2020-10-05 MED ORDER — IBUPROFEN 800 MG PO TABS
800.0000 mg | ORAL_TABLET | Freq: Three times a day (TID) | ORAL | Status: DC
  Administered 2020-10-05 – 2020-10-06 (×3): 800 mg via ORAL
  Filled 2020-10-05 (×3): qty 1

## 2020-10-05 MED ORDER — BENZOCAINE-MENTHOL 20-0.5 % EX AERO: 1.0000 "application " | INHALATION_SPRAY | CUTANEOUS | Status: DC | PRN

## 2020-10-05 MED ORDER — ACETAMINOPHEN 325 MG PO TABS: 650.0000 mg | ORAL_TABLET | ORAL | Status: DC | PRN

## 2020-10-05 MED ORDER — DIPHENHYDRAMINE HCL 50 MG/ML IJ SOLN: 12.5000 mg | INTRAMUSCULAR | Status: DC | PRN

## 2020-10-05 MED ORDER — OXYCODONE-ACETAMINOPHEN 5-325 MG PO TABS: 2.0000 | ORAL_TABLET | ORAL | Status: DC | PRN

## 2020-10-05 MED ORDER — ONDANSETRON HCL 4 MG PO TABS: 4.0000 mg | ORAL_TABLET | ORAL | Status: DC | PRN

## 2020-10-05 MED ORDER — LACTATED RINGERS IV SOLN: 500.0000 mL | Freq: Once | INTRAVENOUS | Status: DC

## 2020-10-05 MED ORDER — LIDOCAINE HCL (PF) 1 % IJ SOLN
INTRAMUSCULAR | Status: DC | PRN
Start: 2020-10-05 — End: 2020-10-05
  Administered 2020-10-05 (×2): 4 mL via EPIDURAL

## 2020-10-05 MED ORDER — SIMETHICONE 80 MG PO CHEW: 80.0000 mg | CHEWABLE_TABLET | ORAL | Status: DC | PRN

## 2020-10-05 MED ORDER — ZOLPIDEM TARTRATE 5 MG PO TABS: 5.0000 mg | ORAL_TABLET | Freq: Every evening | ORAL | Status: DC | PRN

## 2020-10-05 MED ORDER — DIBUCAINE (PERIANAL) 1 % EX OINT: 1.0000 "application " | TOPICAL_OINTMENT | CUTANEOUS | Status: DC | PRN

## 2020-10-05 MED ORDER — SOD CITRATE-CITRIC ACID 500-334 MG/5ML PO SOLN: 30.0000 mL | ORAL | Status: DC | PRN

## 2020-10-05 MED ORDER — COCONUT OIL OIL: 1.0000 "application " | TOPICAL_OIL | Status: DC | PRN

## 2020-10-05 MED ORDER — TERBUTALINE SULFATE 1 MG/ML IJ SOLN: 0.2500 mg | Freq: Once | INTRAMUSCULAR | Status: DC | PRN

## 2020-10-05 MED ORDER — FENTANYL-BUPIVACAINE-NACL 0.5-0.125-0.9 MG/250ML-% EP SOLN
12.0000 mL/h | EPIDURAL | Status: DC | PRN
  Administered 2020-10-05: 12 mL/h via EPIDURAL
  Filled 2020-10-05: qty 250

## 2020-10-05 MED ORDER — OXYCODONE-ACETAMINOPHEN 5-325 MG PO TABS: 1.0000 | ORAL_TABLET | ORAL | Status: DC | PRN

## 2020-10-05 MED ORDER — PHENYLEPHRINE 40 MCG/ML (10ML) SYRINGE FOR IV PUSH (FOR BLOOD PRESSURE SUPPORT): 80.0000 ug | PREFILLED_SYRINGE | INTRAVENOUS | Status: DC | PRN

## 2020-10-05 MED ORDER — LIDOCAINE HCL (PF) 1 % IJ SOLN: 30.0000 mL | INTRAMUSCULAR | Status: DC | PRN

## 2020-10-05 NOTE — Progress Notes (Signed)
Labor Progress Note Beverly Chapman is a 30 y.o. [phone] at [redacted]w[redacted]d presented for IOL for GDMA1.  S: Patient is comfortable with epidural. No concerns.  O:  BP 99/67   Pulse 74   Temp 97.8 F (36.6 C) (Oral)   Resp 16   Ht 5\' 3"  (1.6 m)   Wt 97 kg   LMP 01/07/2020   SpO2 100%   BMI 37.89 kg/m  EFM: 115 bpm/mod var/+accels, no decels TOCO: q71min  CVE: Dilation: 6 Effacement (%): 90 Station: -1 Presentation: Vertex Exam by:: Dr. [email or web address]  AROM PROCEDURE (1PM) Verbal consent obtained. AROM performed with small amounts of clear fluid returned. Baby and patient tolerated procedure well.  A&P: 30 y.o. 38 [redacted]w[redacted]d IOL 2/2 GDMA1. #Labor: Progressing well on pitocin. AROM performed at 1PM, clear fluid, minimal fluid returned. #Pain: Epidural #FWB: Cat 1 #GBS negative #GDMA1: BS at goal.  [redacted]w[redacted]d, DO 2:19 PM

## 2020-10-05 NOTE — Anesthesia Preprocedure Evaluation (Signed)
Anesthesia Evaluation  Patient identified by MRN, date of birth, ID band Patient awake    Reviewed: Allergy & Precautions, Patient's Chart, lab work & pertinent test results  Airway Mallampati: II  TM Distance: >3 FB Neck ROM: Full    Dental no notable dental hx. (+) Teeth Intact   Pulmonary neg pulmonary ROS,    Pulmonary exam normal breath sounds clear to auscultation       Cardiovascular negative cardio ROS Normal cardiovascular exam Rhythm:Regular Rate:Normal     Neuro/Psych negative neurological ROS  negative psych ROS   GI/Hepatic Neg liver ROS, GERD  ,  Endo/Other  diabetes, Well Controlled, GestationalObesity  Renal/GU negative Renal ROS  negative genitourinary   Musculoskeletal negative musculoskeletal ROS (+)   Abdominal (+) + obese,   Peds  Hematology  (+) anemia ,   Anesthesia Other Findings   Reproductive/Obstetrics (+) Pregnancy Undesired fertility                             Anesthesia Physical Anesthesia Plan  ASA: 2  Anesthesia Plan: Epidural   Post-op Pain Management:    Induction:   PONV Risk Score and Plan:   Airway Management Planned: Natural Airway  Additional Equipment:   Intra-op Plan:   Post-operative Plan:   Informed Consent: I have reviewed the patients History and Physical, chart, labs and discussed the procedure including the risks, benefits and alternatives for the proposed anesthesia with the patient or authorized representative who has indicated his/her understanding and acceptance.       Plan Discussed with: Anesthesiologist  Anesthesia Plan Comments:         Anesthesia Quick Evaluation

## 2020-10-05 NOTE — Discharge Summary (Signed)
Postpartum Discharge Summary  Date of Service updated 10/06/20     Patient Name: Beverly Chapman DOB: 30-Mar-1990 MRN: [id number]  Date of admission: 10/05/2020 Delivery date:10/05/2020  Delivering provider: Isaias Sakai  Date of discharge: 10/06/2020  Admitting diagnosis: Gestational diabetes [O24.419] Intrauterine pregnancy: [redacted]w[redacted]d     Secondary diagnosis:  Principal Problem:   Supervision of high risk pregnancy, antepartum Active Problems:   History of preterm delivery, currently pregnant   Gestational diabetes mellitus (GDM)   Anemia of pregnancy   Unwanted fertility   Gestational diabetes  Additional problems: None    Discharge diagnosis: Term Pregnancy Delivered and GDM A1                                              Post partum procedures: None Augmentation: AROM and Pitocin Complications: None  Hospital course: Induction of Labor With Vaginal Delivery   30 y.o. yo [phone] at [redacted]w[redacted]d was admitted to the hospital 10/05/2020 for induction of labor.  Indication for induction: A1 DM.  Patient had an uncomplicated labor course as follows: Membrane Rupture Time/Date: 12:52 PM ,10/05/2020   Delivery Method:Vaginal, Spontaneous  Episiotomy: None  Lacerations:  None  Details of delivery can be found in separate delivery note.  Patient had a routine postpartum course. Patient is discharged home 10/06/20.  Newborn Data: Birth date:10/05/2020  Birth time:3:55 PM  Gender:Female  Living status:Living  Apgars:9 ,9  Weight:3150 g   Magnesium Sulfate received: No BMZ received: No Rhophylac:N/A MMR:N/A T-DaP:Given prenatally Flu: N/A Transfusion:No  Physical exam  Vitals:   10/05/20 1825 10/05/20 1931 10/05/20 2341 10/06/20 0452  BP: 107/79 103/65 102/70 106/69  Pulse: 61 77 61 64  Resp: $Remo'19 18 16 18  'wdUAh$ Temp: (!) 97.5 F (36.4 C) (!) 97.5 F (36.4 C) 98.1 F (36.7 C) 98.2 F (36.8 C)  TempSrc: Oral Oral Oral Oral  SpO2:  100% 100% 99%  Weight:      Height:        General: alert, cooperative, and no distress Lochia: appropriate Uterine Fundus: firm Incision: N/A DVT Evaluation: No evidence of DVT seen on physical exam. Labs: Lab Results  Component Value Date   WBC 8.9 10/05/2020   HGB 10.7 (L) 10/05/2020   HCT 34.1 (L) 10/05/2020   MCV 75.9 (L) 10/05/2020   PLT 219 10/05/2020   CMP Latest Ref Rng & Units 08/28/2017  Glucose 65 - 99 mg/dL 79  BUN 6 - 20 mg/dL <5(L)  Creatinine 0.44 - 1.00 mg/dL 0.58  Sodium 135 - 145 mmol/L 136  Potassium 3.5 - 5.1 mmol/L 4.0  Chloride 101 - 111 mmol/L 103  CO2 22 - 32 mmol/L 25  Calcium 8.9 - 10.3 mg/dL 9.4  Total Protein 6.5 - 8.1 g/dL 7.9  Total Bilirubin 0.3 - 1.2 mg/dL 0.6  Alkaline Phos 38 - 126 U/L 46  AST 15 - 41 U/L 17  ALT 14 - 54 U/L 11(L)   Edinburgh Score: Edinburgh Postnatal Depression Scale Screening Tool 03/29/2018  I have been able to laugh and see the funny side of things. 0  I have looked forward with enjoyment to things. 0  I have blamed myself unnecessarily when things went wrong. 0  I have been anxious or worried for no good reason. 0  I have felt scared or panicky for no good reason. 0  Things have been getting on top of me. 0  I have been so unhappy that I have had difficulty sleeping. 0  I have felt sad or miserable. 0  I have been so unhappy that I have been crying. 0  The thought of harming myself has occurred to me. 0  Edinburgh Postnatal Depression Scale Total 0     After visit meds:  Allergies as of 10/06/2020   No Known Allergies      Medication List     STOP taking these medications    Accu-Chek Guide test strip Generic drug: glucose blood   Accu-Chek Guide w/Device Kit   Accu-Chek Softclix Lancets lancets   ferrous sulfate 325 (65 FE) MG EC tablet       TAKE these medications    acetaminophen 500 MG tablet Commonly known as: TYLENOL Take 2 tablets (1,000 mg total) by mouth every 6 (six) hours as needed. What changed:  medication  strength how much to take   coconut oil Oil Apply 1 application topically as needed.   ibuprofen 600 MG tablet Commonly known as: ADVIL Take 1 tablet (600 mg total) by mouth every 8 (eight) hours.   Prenatal Complete 14-0.4 MG Tabs Take 1 tablet by mouth daily.         Discharge home in stable condition Infant Feeding: Bottle and Breast Infant Disposition:home with mother Discharge instruction: per After Visit Summary and Postpartum booklet. Activity: Advance as tolerated. Pelvic rest for 6 weeks.  Diet: routine diet Future Appointments:No future appointments.  Follow up Visit: Message sent to [facility] 10/06/20 by Sylvester Harder.  Please schedule this patient for a In person postpartum visit in 4 weeks with the following provider: MD. Additional Postpartum F/U:2 hour GTT and Interval Tubal (papers signed 7/5)   High risk pregnancy complicated by: GDM Delivery mode:  Vaginal, Spontaneous  Anticipated Birth Control:  PP Depo given and Plans Interval BTL   04/19/958 Arrie Senate, MD

## 2020-10-05 NOTE — Anesthesia Procedure Notes (Signed)
Epidural Patient location during procedure: OB Start time: 10/05/2020 11:07 AM End time: 10/05/2020 10:26 AM  Staffing Anesthesiologist: Mal Amabile, MD  Preanesthetic Checklist Completed: patient identified, IV checked, site marked, risks and benefits discussed, surgical consent, monitors and equipment checked, pre-op evaluation and timeout performed  Epidural Patient position: sitting Prep: DuraPrep and site prepped and draped Patient monitoring: continuous pulse ox and blood pressure Approach: midline Location: L3-L4 Injection technique: LOR air  Needle:  Needle type: Tuohy  Needle gauge: 17 G Needle length: 9 cm and 9 Needle insertion depth: 5 cm Catheter type: closed end flexible Catheter size: 19 Gauge Catheter at skin depth: 10 cm Test dose: negative and Other  Assessment Events: blood not aspirated, injection not painful, no injection resistance, no paresthesia and negative IV test  Additional Notes Patient identified. Risks and benefits discussed including failed block, incomplete  Pain control, post dural puncture headache, nerve damage, paralysis, blood pressure Changes, nausea, vomiting, reactions to medications-both toxic and allergic and post Partum back pain. All questions were answered. Patient expressed understanding and wished to proceed. Sterile technique was used throughout procedure. Epidural site was Dressed with sterile barrier dressing. No paresthesias, signs of intravascular injection Or signs of intrathecal spread were encountered.  Patient was more comfortable after the epidural was dosed. Please see RN's note for documentation of vital signs and FHR which are stable. Reason for block:procedure for pain

## 2020-10-05 NOTE — H&P (Signed)
Beverly Chapman is a 30 y.o. female [phone] at [redacted]w[redacted]d by Last Menstrual Period c/w 19 wk [place] presenting for IOL for GDMA1, unsure control.  Understands indication for IOL. Denies LOF, VB. Having some mild contractions q5-67min, but not very painful. Did not check her BS this morning. Denies HA, CP, SOB, RUQ pain, vision changes.   OB History     Gravida  4   Para  3   Term  2   Preterm  1   AB  0   Living  3      SAB  0   IAB  0   Ectopic  0   Multiple  0   Live Births  3          Past Medical History:  Diagnosis Date   Preterm labor    Past Surgical History:  Procedure Laterality Date   NO PAST SURGERIES     Family History: family history is not on file. Social History:  reports that she has never smoked. She has never used smokeless tobacco. She reports previous alcohol use. She reports that she does not use drugs.     Maternal Diabetes: Yes:  Diabetes Type:  Diet controlled Genetic Screening: Normal Maternal Ultrasounds/Referrals: Normal Fetal Ultrasounds or other Referrals:  None Maternal Substance Abuse:  No Significant Maternal Medications:  None Significant Maternal Lab Results:  Group B Strep negative Other Comments:  None  Review of Systems  Constitutional:  Negative for chills and fever.  Respiratory:  Negative for cough and shortness of breath.   Cardiovascular:  Negative for chest pain.  Gastrointestinal:  Negative for abdominal pain.  Skin:  Negative for rash.  Neurological:  Negative for headaches.  History Dilation: 5.5 Effacement (%): 70 Station: -2 Exam by:: Johnathan Hausen RN Blood pressure 109/71, pulse 94, temperature 98.6 F (37 C), temperature source Oral, resp. rate 16, height 5\' 3"  (1.6 m), weight 97 kg, last menstrual period 01/07/2020, unknown if currently breastfeeding. Maternal Exam:  Cervix: Cervix evaluated by digital exam.     Fetal Exam Fetal Monitor Review: Baseline rate: 125 bpm.  Variability: moderate (6-25  bpm).   Pattern: accelerations present and no decelerations.   Fetal State Assessment: Category I - tracings are normal.  Physical Exam Constitutional:      General: She is not in acute distress.    Appearance: She is well-developed.  HENT:     Head: Normocephalic and atraumatic.  Cardiovascular:     Rate and Rhythm: Normal rate.  Pulmonary:     Effort: Pulmonary effort is normal. No respiratory distress.  Musculoskeletal:        General: No tenderness.  Skin:    General: Skin is warm and dry.     Findings: No erythema or rash.  Neurological:     Mental Status: She is alert and oriented to person, place, and time.    Prenatal labs: ABO, Rh: --/--/PENDING (07/19 03-20-1990) Antibody: PENDING (07/19 0838) Rubella: 2.37 (04/26 1552) RPR: Non Reactive (07/05 0924)  HBsAg: Negative (04/26 1552)  HIV: Non Reactive (07/05 0924)  GBS: Negative/-- (07/05 1143)   Assessment/Plan: 10-03-1970 30 y.o. [place] Pih at [redacted]w[redacted]d by LMP here for IOL for GDMA1.  #Labor:  Pitocin, followed by AROM. #Pain: May have epidural if she desires. #FWB:  Cat I #ID:  GBS Neg  #MOF:  Formula #MOC:  Interval tubal, with Depo transitioning #CIRC: Considering, but may delay after hospitalization #A1GDM: q4h BS checks (due to non-compliance  and undetermined control prenatally).  Jen Mow, DO 10/05/2020, 9:44 AM

## 2020-10-06 ENCOUNTER — Encounter: Payer: Self-pay | Admitting: General Practice

## 2020-10-06 DIAGNOSIS — O24420 Gestational diabetes mellitus in childbirth, diet controlled: Secondary | ICD-10-CM | POA: Diagnosis not present

## 2020-10-06 DIAGNOSIS — Z3A38 38 weeks gestation of pregnancy: Secondary | ICD-10-CM | POA: Diagnosis not present

## 2020-10-06 LAB — GLUCOSE, CAPILLARY: Glucose-Capillary: 83 mg/dL (ref 70–99)

## 2020-10-06 MED ORDER — ACETAMINOPHEN 500 MG PO TABS: 1000.0000 mg | ORAL_TABLET | Freq: Four times a day (QID) | ORAL | Status: AC | PRN

## 2020-10-06 MED ORDER — COCONUT OIL OIL: 1.0000 "application " | TOPICAL_OIL | 0 refills | Status: AC | PRN

## 2020-10-06 MED ORDER — IBUPROFEN 600 MG PO TABS: 600.0000 mg | ORAL_TABLET | Freq: Three times a day (TID) | ORAL | Status: AC

## 2020-10-06 NOTE — Anesthesia Postprocedure Evaluation (Signed)
Anesthesia Post Note  Patient: Beverly Chapman  Procedure(s) Performed: AN AD HOC LABOR EPIDURAL     Patient location during evaluation: Mother Baby Anesthesia Type: Epidural Level of consciousness: awake and alert Pain management: pain level controlled Vital Signs Assessment: post-procedure vital signs reviewed and stable Respiratory status: spontaneous breathing, nonlabored ventilation and respiratory function stable Cardiovascular status: stable Postop Assessment: no headache, no backache and epidural receding Anesthetic complications: no   No notable events documented.  Last Vitals:  Vitals:   10/05/20 2341 10/06/20 0452  BP: 102/70 106/69  Pulse: 61 64  Resp: 16 18  Temp: 36.7 C 36.8 C  SpO2: 100% 99%    Last Pain:  Vitals:   10/06/20 0750  TempSrc:   PainSc: 0-No pain   Pain Goal:                   Trellis Paganini

## 2020-10-07 ENCOUNTER — Telehealth: Payer: Self-pay

## 2020-10-07 NOTE — Telephone Encounter (Signed)
Transition Care Management Follow-up Telephone Call Date of discharge and from where: 10/06/2020-[facility] How have you been since you were released from the hospital? Patient stated she is doing fine. Any questions or concerns? No  Items Reviewed: Did the pt receive and understand the discharge instructions provided? Yes  Medications obtained and verified? Yes  Other? No  Any new allergies since your discharge? No  Dietary orders reviewed? N/A Do you have support at home? Yes   Home Care and Equipment/Supplies: Were home health services ordered? not applicable If so, what is the name of the agency? N/A  Has the agency set up a time to come to the patient's home? not applicable Were any new equipment or medical supplies ordered?  No What is the name of the medical supply agency? N/A Were you able to get the supplies/equipment? not applicable Do you have any questions related to the use of the equipment or supplies? No  Functional Questionnaire: (I = Independent and D = Dependent) ADLs: I  Bathing/Dressing- I  Meal Prep- I  Eating- I  Maintaining continence- I  Transferring/Ambulation- I  Managing Meds- I  Follow up appointments reviewed:  PCP Hospital f/u appt confirmed? No   Specialist Hospital f/u appt confirmed? No   Are transportation arrangements needed? No  If their condition worsens, is the pt aware to call PCP or go to the Emergency Dept.? Yes Was the patient provided with contact information for the PCP's office or ED? Yes Was to pt encouraged to call back with questions or concerns? Yes

## 2020-10-13 ENCOUNTER — Inpatient Hospital Stay (HOSPITAL_COMMUNITY): Admit: 2020-10-13 | Payer: Self-pay

## 2020-10-16 ENCOUNTER — Telehealth (HOSPITAL_COMMUNITY): Payer: Self-pay

## 2020-10-16 NOTE — Telephone Encounter (Signed)
No answer. Left message to return nurse call.  Marcelino Duster [facility] 10/16/2020,0955

## 2020-12-06 ENCOUNTER — Ambulatory Visit: Payer: Medicaid Other | Admitting: Student

## 2021-11-06 IMAGING — US US MFM OB FOLLOW-UP
1 series · 14 of 28 positions shown · non-contrast
Comparison: none

[Series 1: us mfm ob follow-up · 14 of 31 slices shown]
[im 2/31]
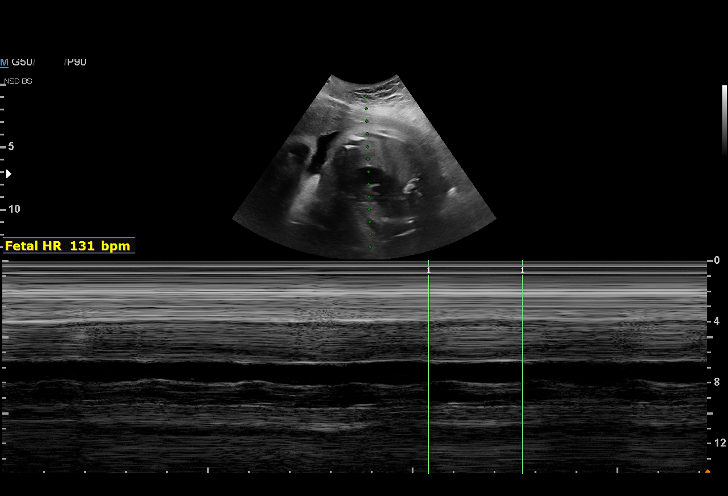
[im 4/31]
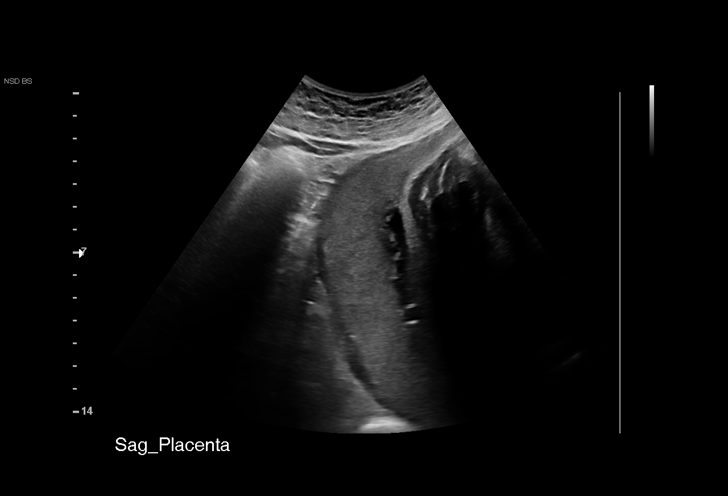
[im 6/31]
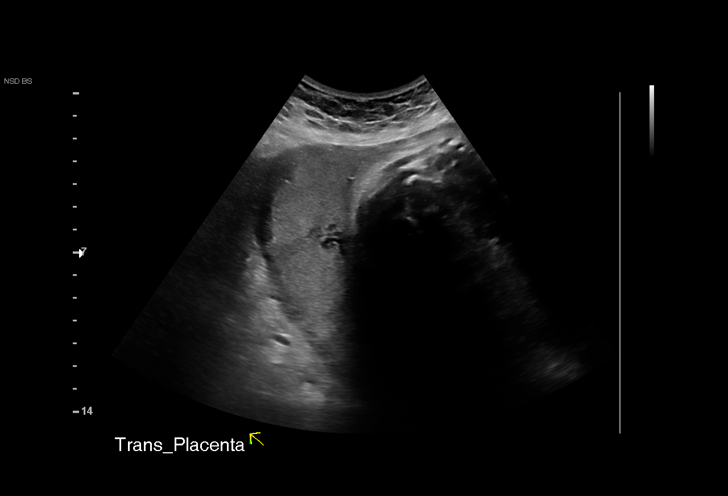
[im 8/31]
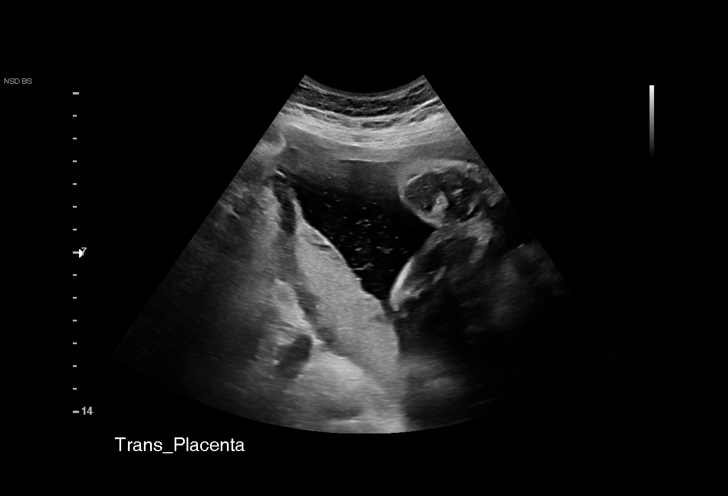
[im 11/31]
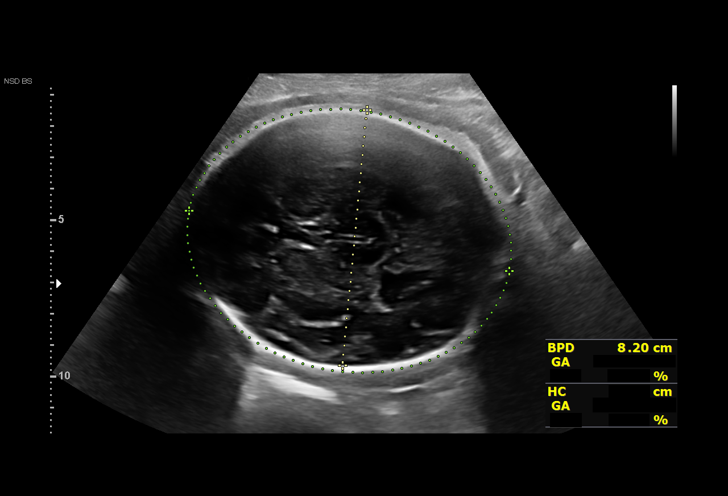
[im 13/31]
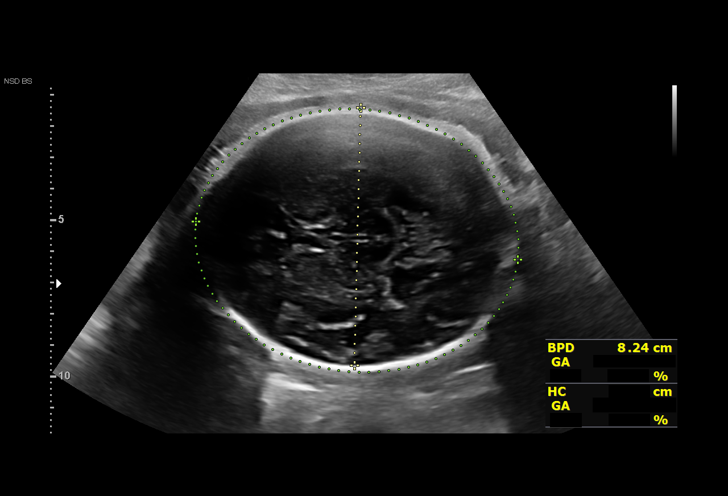
[im 15/31]
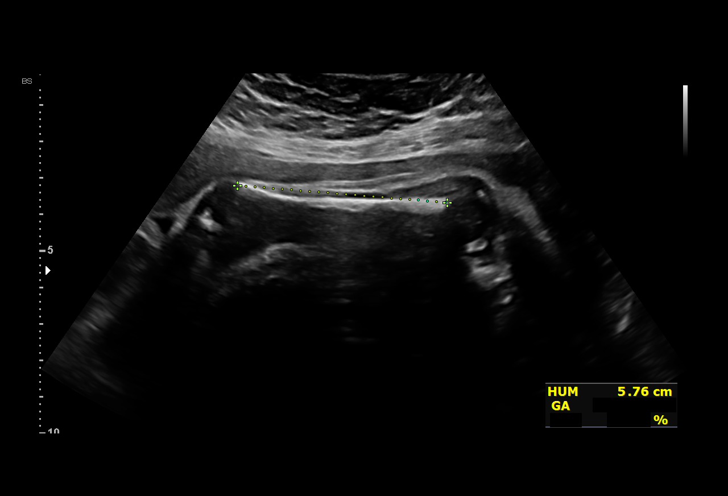
[im 17/31]
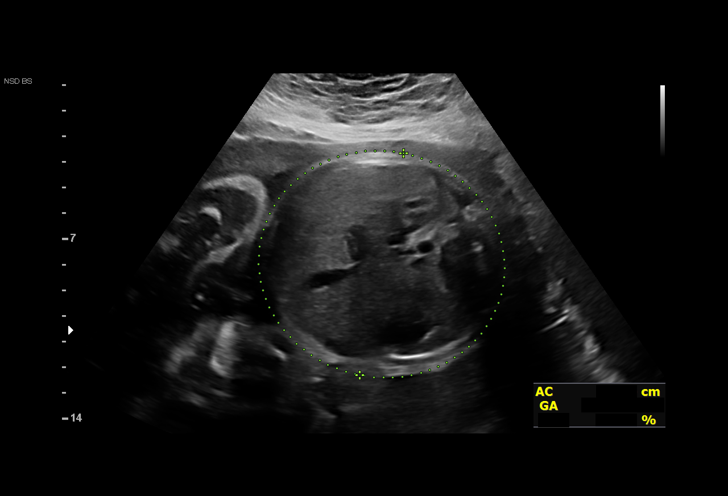
[im 19/31]
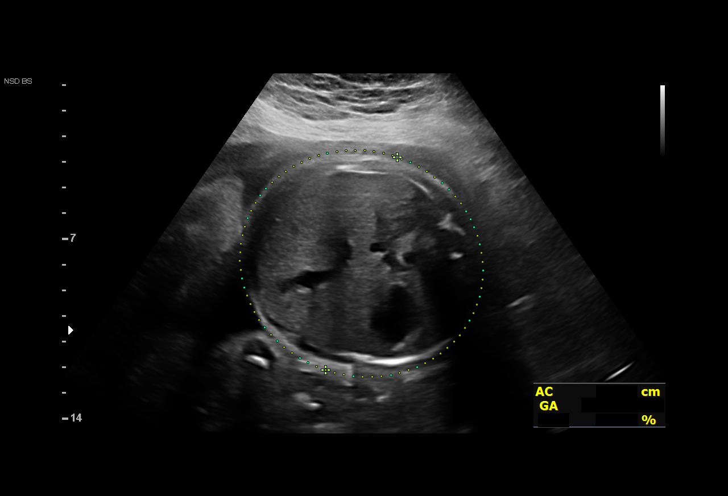
[im 22/31]
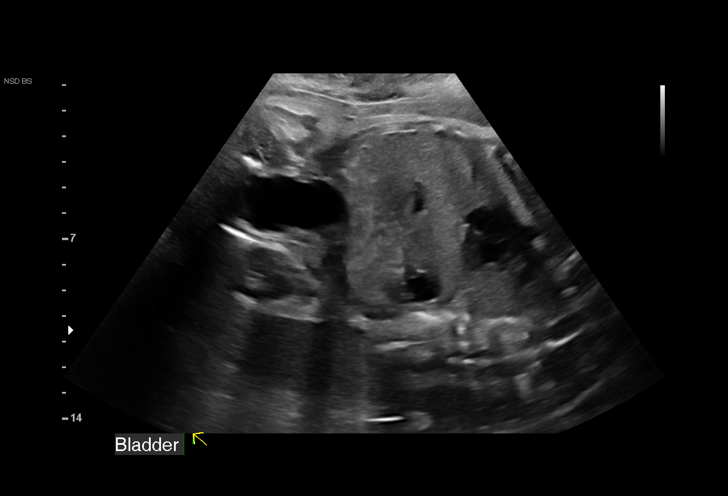
[im 24/31]
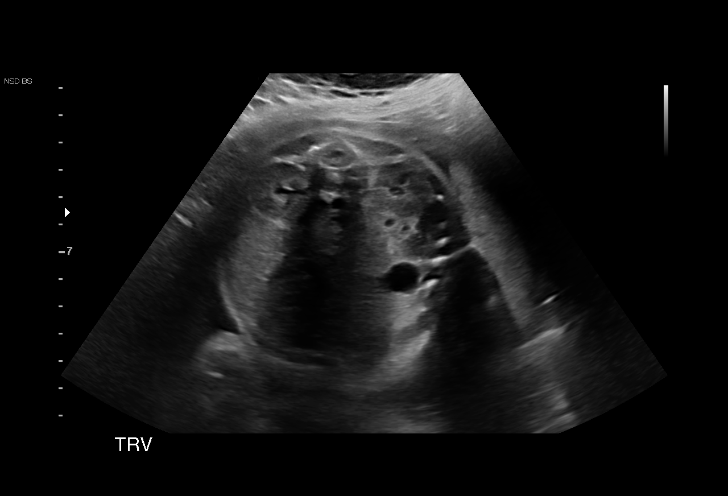
[im 26/31]
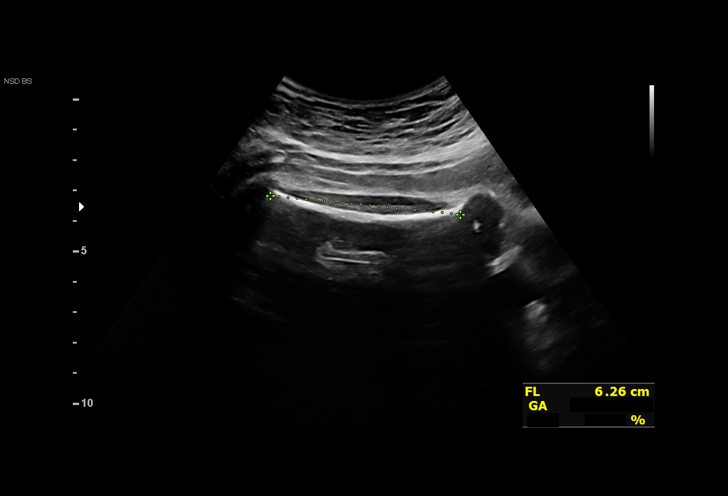
[im 28/31]
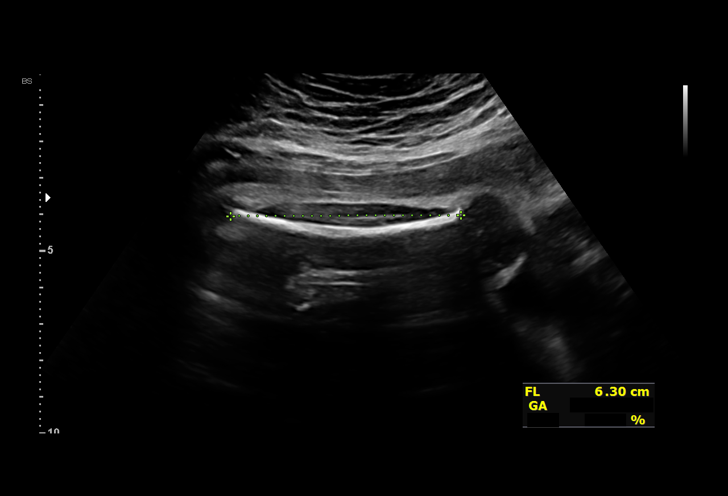
[im 31/31]
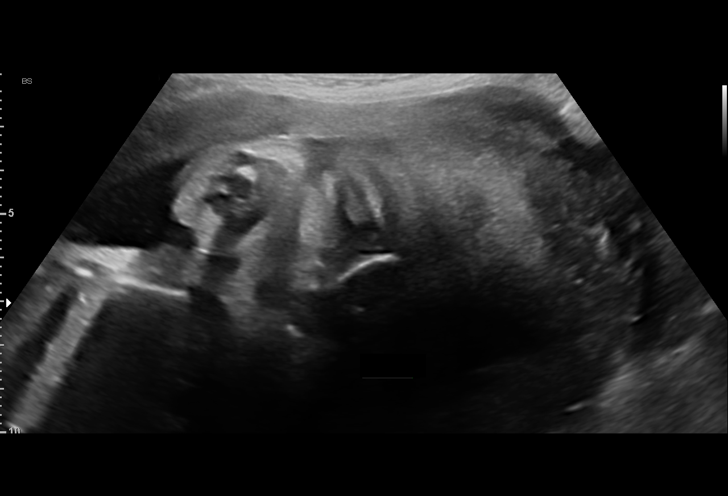

[14 of 28 positions shown; findings below may reference images not displayed]

ELENES NP

Indications

 33 weeks gestation of pregnancy
 Obesity complicating pregnancy, second
 trimester (Pre-G BMI 31)
 Substance abuse affecting pregnancy,
 antepartum (+UDS THC)
 Poor obstetric history (prior pre-term labor) -
 [EMAIL] 36 weeks
 Encounter for other antenatal screening
 follow-up
 Negative [id number]
Fetal Evaluation

 Num Of Fetuses:          1
 Fetal Heart Rate(bpm):   131
 Cardiac Activity:        Observed
 Presentation:            Cephalic
 Placenta:                Posterior Fundal
 P. Cord Insertion:       Previously Visualized

 Amniotic Fluid
 AFI FV:      Within normal limits

 AFI Sum(cm)     %Tile       Largest Pocket(cm)
 13.09           41

 RUQ(cm)       RLQ(cm)       LUQ(cm)        LLQ(cm)

Biometry

 BPD:      82.3  mm     G. Age:  33w 1d         46  %    CI:        77.75   %    70 - 86
                                                         FL/HC:       21.2  %    19.9 -
 HC:      295.4  mm     G. Age:  32w 5d         10  %    HC/AC:       1.02       0.96 -
 AC:      288.3  mm     G. Age:  32w 6d         46  %    FL/BPD:      76.2  %    71 - 87
 FL:       62.7  mm     G. Age:  32w 3d         24  %    FL/AC:       21.7  %    20 - 24
 HUM:      57.3  mm     G. Age:  33w 2d         65  %
 LV:        3.3  mm

 Est. FW:    9568   gm     4 lb 8 oz     32  %
OB History

 Gravidity:    4         Term:   2        Prem:   1
 Living:       3
Gestational Age

 LMP:           33w 0d        Date:  01/07/20                 EDD:   10/13/20
 U/S Today:     32w 6d                                        EDD:   10/14/20
 Best:          33w 0d     Det. By:  LMP  (01/07/20)          EDD:   10/13/20
Anatomy

 Cranium:               Appears normal         LVOT:                   Previously seen
 Cavum:                 Previously seen        Aortic Arch:            Previously seen
 Ventricles:            Appears normal         Ductal Arch:            Previously seen
 Choroid Plexus:        Previously seen        Diaphragm:              Appears normal
 Cerebellum:            Previously seen        Stomach:                Appears normal, left
                                                                       sided
 Posterior Fossa:       Previously seen        Abdomen:                Appears normal
 Nuchal Fold:           Previously seen        Abdominal Wall:         Previously seen
 Face:                  Orbits and profile     Cord Vessels:           Previously seen
                        previously seen
 Lips:                  Previously seen        Kidneys:                Appear normal
 Palate:                Previously seen        Bladder:                Appears normal
 Thoracic:              Previously seen        Spine:                  Previously seen
 Heart:                 Previously seen        Upper Extremities:      Previously seen
 RVOT:                  Previously seen        Lower Extremities:      Previously seen

 Other:  Male gender previously seen. Nasal bone, Heels/feet, open hands/5th
         digits,VC, 3VV and 3VTV previously visualized. Technically difficult
         due to maternal habitus.
Cervix Uterus Adnexa

 Cervix
 Not visualized (advanced GA >09wks)

 Right Ovary
 Previously seen

 Left Ovary
 Previously seen.
Impression

 Follow up growth due to elevated BMI and substance abuse.
 Normal interval growth with measurements consistent with
 dates
 Good fetal movement and amniotic fluid volume
Recommendations

 Follow up growth in 4 weeks.

## 2021-12-13 IMAGING — US US FETAL BPP W/ NON-STRESS
1 series · 14 of 14 positions shown · non-contrast
Comparison: none

[Series 1: us fetal bpp w/ non-stress · 14 acquisitions, 14 frames shown]
[im 1/14]
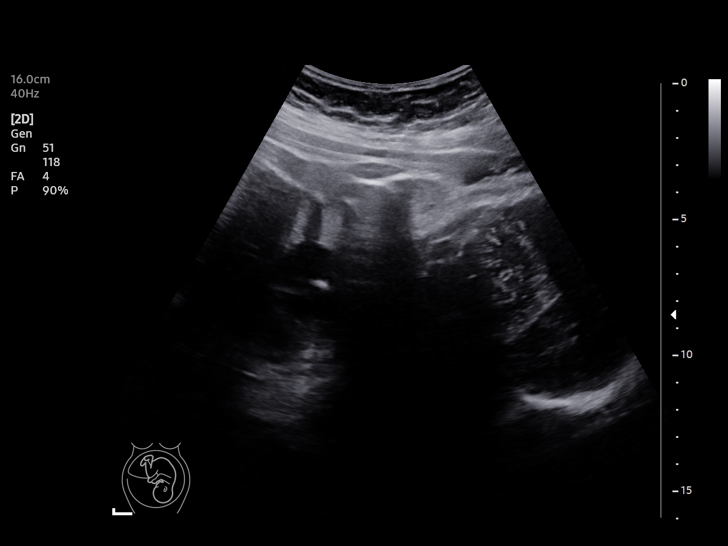
[im 2/14]
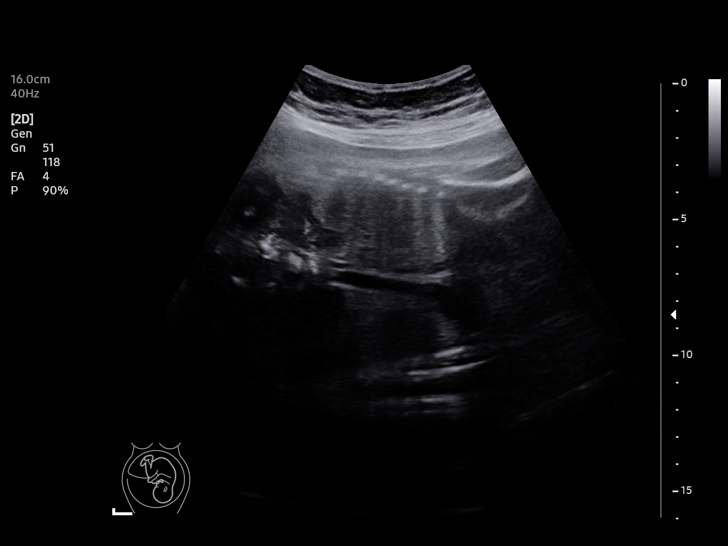
[im 3/14]
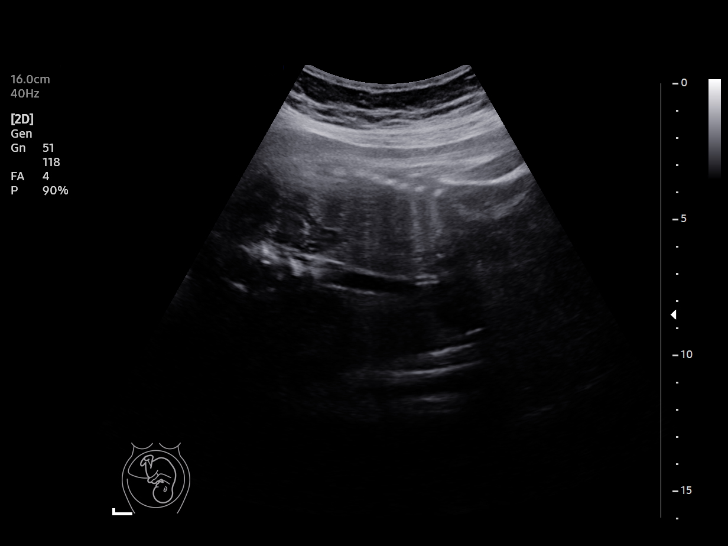
[im 4/14]
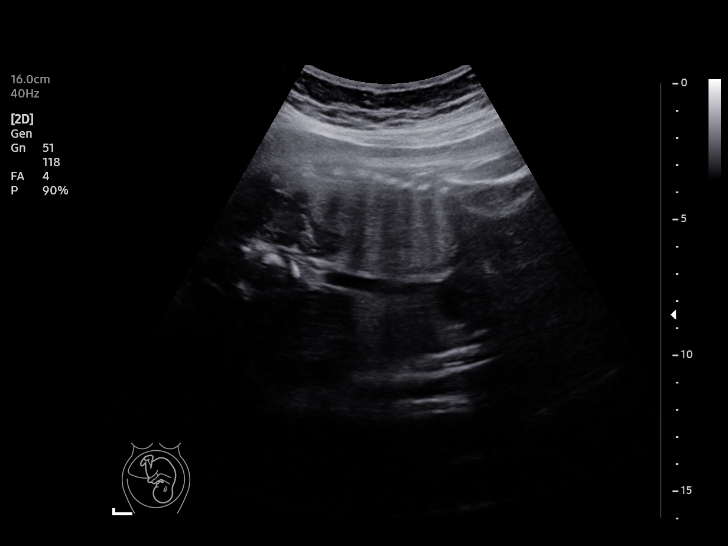
[im 5/14]
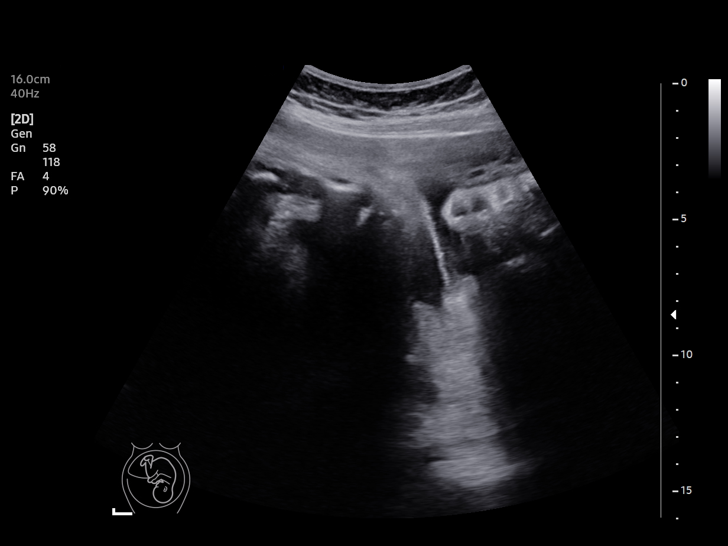
[im 6/14]
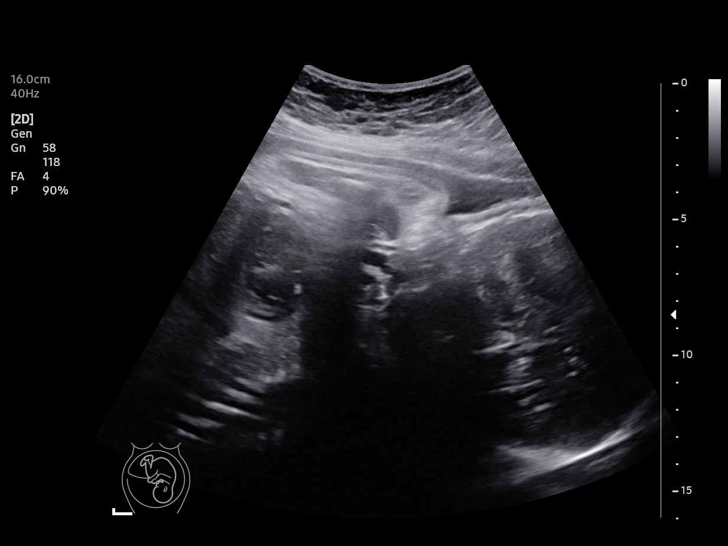
[im 7/14]
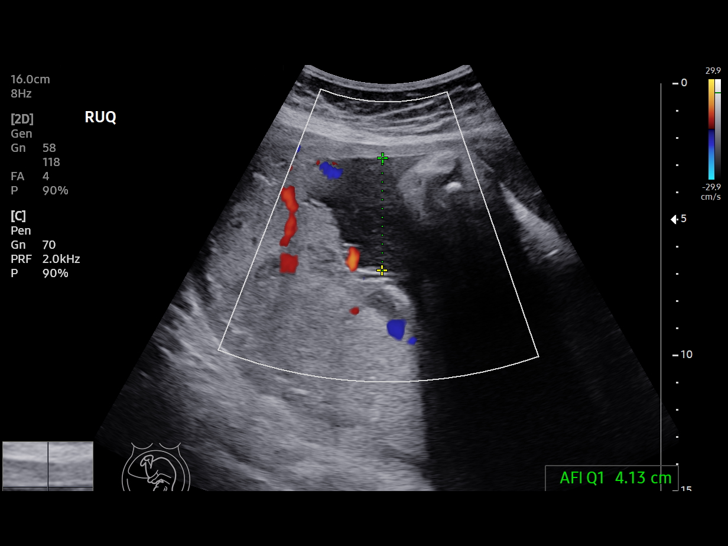
[im 8/14]
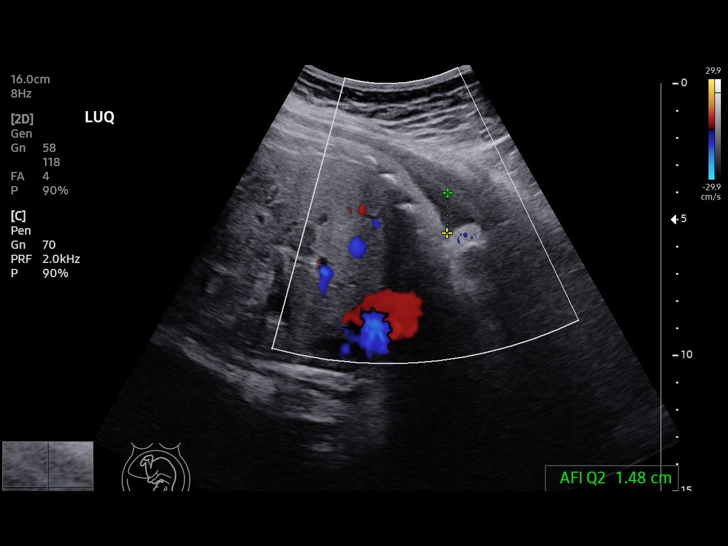
[im 9/14]
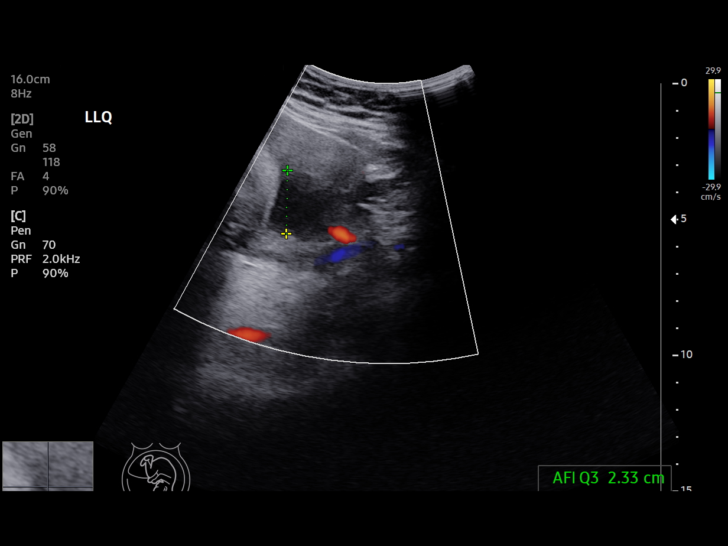
[im 10/14]
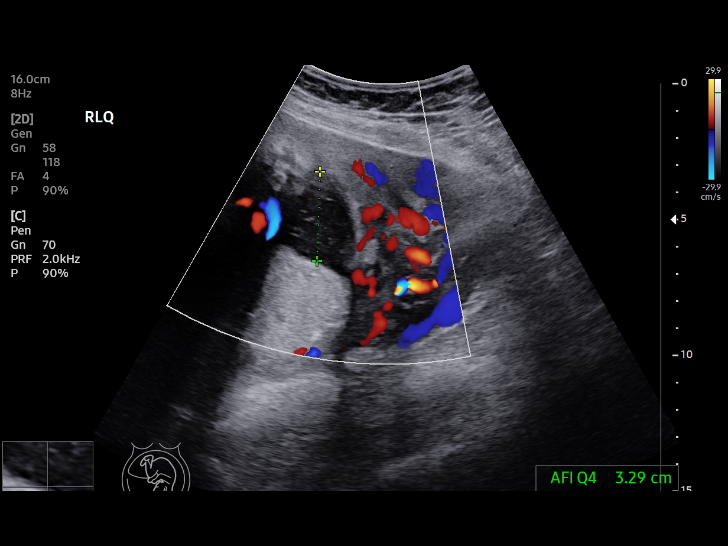
[im 11/14]
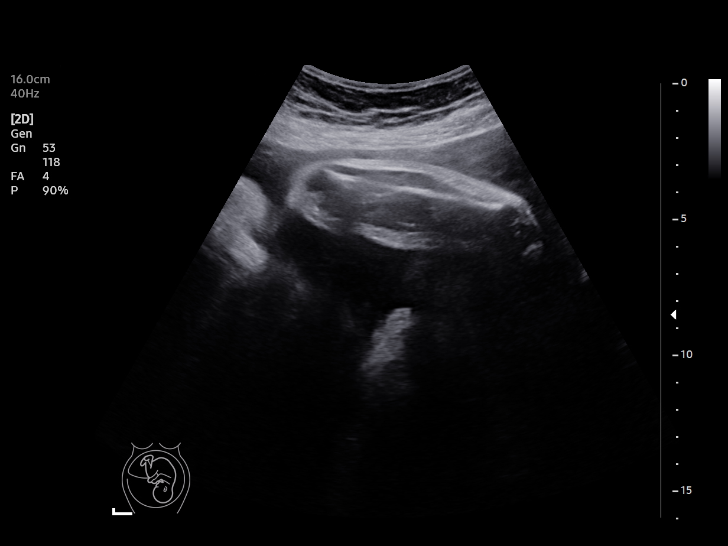
[im 12/14]
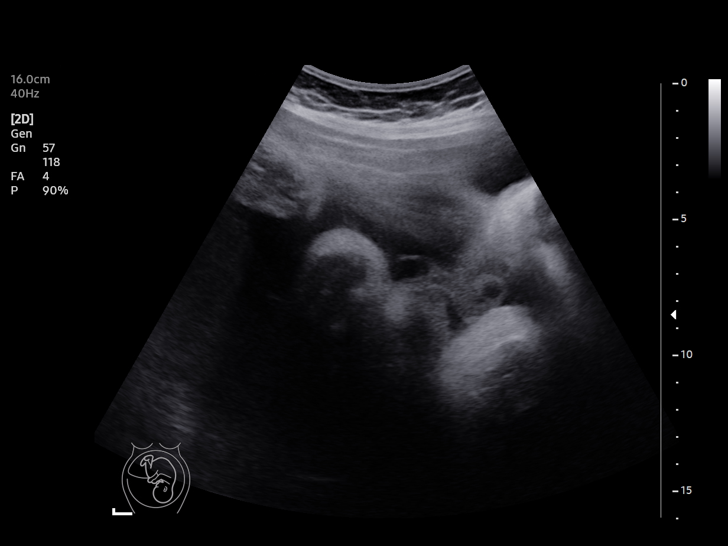
[im 13/14]
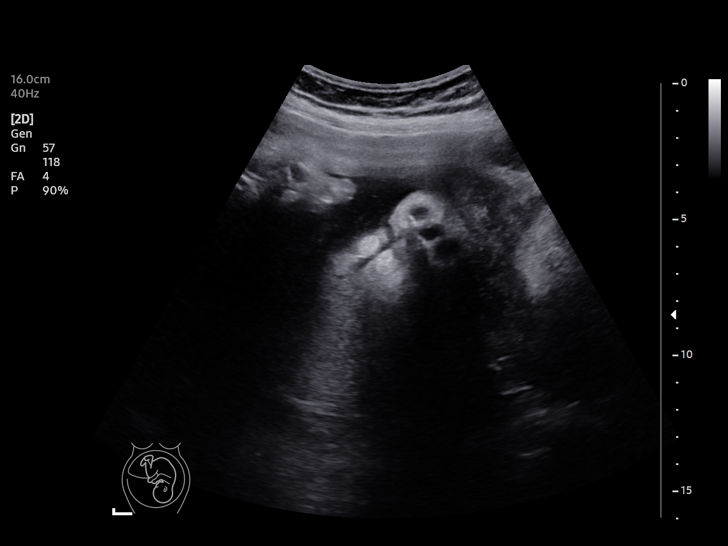
[im 14/14]
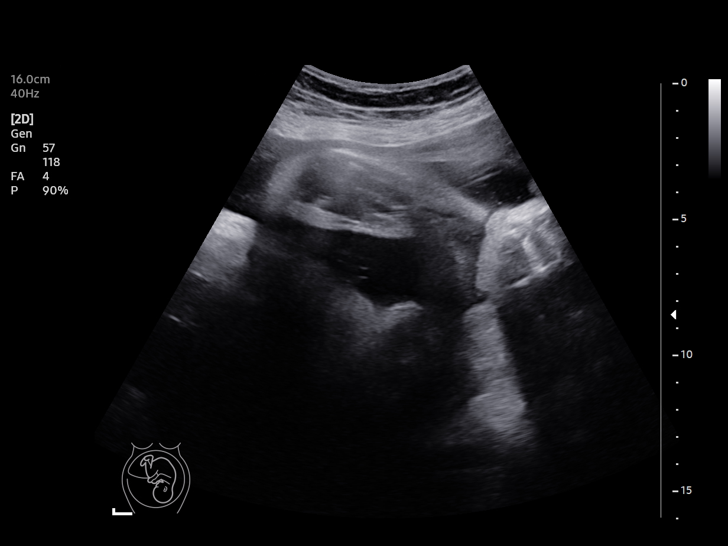

[14 of 14 positions shown; findings below may reference images not displayed]

[REDACTED]care at
                   SHEKHAR NP

 1  US FETAL BPP W/NONSTRESS              76818.4     ABUNDIA MAGNO

Service(s) Provided

Indications

 38 weeks gestation of pregnancy
 Gestational diabetes in pregnancy, diet
 controlled
Fetal Evaluation

 Num Of Fetuses:         1
 Preg. Location:         Intrauterine
 Cardiac Activity:       Observed
 Presentation:           Cephalic

 Amniotic Fluid
 AFI FV:      Within normal limits

 AFI Sum(cm)     %Tile       Largest Pocket(cm)
 11.23           36

 RUQ(cm)       RLQ(cm)       LUQ(cm)        LLQ(cm)

Biophysical Evaluation
 Amniotic F.V:   Pocket => 2 cm             F. Tone:        Observed
 F. Movement:    Observed                   N.S.T:          Reactive
 F. Breathing:   Observed                   Score:          [DATE]
OB History

 Gravidity:    4         Term:   2        Prem:   1
 Living:       3
Gestational Age

 LMP:           38w 2d        Date:  01/07/20                 EDD:   10/13/20
 Best:          38w 2d     Det. By:  LMP  (01/07/20)          EDD:   10/13/20
Impression

 Vertex
Recommendations

 Continue with weekly antenatal testing as indicated

## 2023-02-19 ENCOUNTER — Ambulatory Visit (HOSPITAL_COMMUNITY)
Admission: RE | Admit: 2023-02-19 | Discharge: 2023-02-19 | Disposition: A | Discharge status: Home / Self Care | Payer: Self-pay | Source: Ambulatory Visit | Attending: Family Medicine | Admitting: Family Medicine

## 2023-02-19 ENCOUNTER — Encounter (HOSPITAL_COMMUNITY): Payer: Self-pay

## 2023-02-19 ENCOUNTER — Ambulatory Visit (INDEPENDENT_AMBULATORY_CARE_PROVIDER_SITE_OTHER): Payer: Self-pay

## 2023-02-19 VITALS — BP 130/86 | HR 84 | Temp 98.6°F | Resp 16

## 2023-02-19 DIAGNOSIS — R109 Unspecified abdominal pain: Secondary | ICD-10-CM

## 2023-02-19 LAB — POCT URINALYSIS DIP (MANUAL ENTRY)
Bilirubin, UA: NEGATIVE
Glucose, UA: NEGATIVE mg/dL
Ketones, POC UA: NEGATIVE mg/dL
Nitrite, UA: NEGATIVE
Protein Ur, POC: NEGATIVE mg/dL
Spec Grav, UA: 1.015 (ref 1.010–1.025)
Urobilinogen, UA: 0.2 U/dL
pH, UA: 7 (ref 5.0–8.0)

## 2023-02-19 MED ORDER — KETOROLAC TROMETHAMINE 30 MG/ML IJ SOLN
INTRAMUSCULAR | Status: AC
  Filled 2023-02-19: qty 1

## 2023-02-19 MED ORDER — KETOROLAC TROMETHAMINE 30 MG/ML IJ SOLN
30.0000 mg | Freq: Once | INTRAMUSCULAR | Status: AC
  Administered 2023-02-19: 30 mg via INTRAMUSCULAR

## 2023-02-19 NOTE — ED Triage Notes (Signed)
Pt c/o pain when urinating for about 1 month. She also has had abdominal pain, blood in urine,and headaches that come and go for about 1 month

## 2023-02-19 NOTE — ED Provider Notes (Signed)
[facility]    CSN: [id number] Arrival date & time: 02/19/23  2952      History   Chief Complaint Chief Complaint  Patient presents with   Urinary Frequency    I've been having bad pain when I urinate, it comes out very slow and painful and feels like something is blocking the urine from coming out. I am also having back pain and pain abdominal pain - Entered by patient   Appointment    HPI Beverly Chapman is a 32 y.o. female.   Patient is presenting with pain that comes and goes as well as some mild blood in her urine.  Patient states that he has been dealing with this pain for months now and feels like she has an infection.  Patient denies any fevers or chills.  Patient states that the pain is located on the right flank area.  Patient states that when she drinks lots of water the pain goes away.  Patient does not have insurance at this time.  Patient has never had a kidney stone in the past.  Patient otherwise has no other concerns at this time.   Urinary Frequency    Past Medical History:  Diagnosis Date   Preterm labor     Patient Active Problem List   Diagnosis Date Noted   Unwanted fertility 09/29/2020   Gestational diabetes mellitus (GDM) 09/22/2020   Anemia of pregnancy 09/22/2020   Supervision of high risk pregnancy, antepartum 07/13/2020   Hx of migraines 07/13/2020   Sialorrhea 07/13/2020   Gingivitis 02/13/2018   History of preterm delivery, currently pregnant 10/18/2017    Past Surgical History:  Procedure Laterality Date   NO PAST SURGERIES      OB History     Gravida  4   Para  4   Term  3   Preterm  1   AB  0   Living  4      SAB  0   IAB  0   Ectopic  0   Multiple  0   Live Births  4            Home Medications    Prior to Admission medications   Medication Sig Start Date End Date Taking? Authorizing Provider  acetaminophen (TYLENOL) 500 MG tablet Take 2 tablets (1,000 mg total) by mouth every 6 (six)  hours as needed. 10/06/20   Alric Seton, MD  coconut oil OIL Apply 1 application topically as needed. 10/06/20   Alric Seton, MD  ibuprofen (ADVIL) 600 MG tablet Take 1 tablet (600 mg total) by mouth every 8 (eight) hours. 10/06/20   Alric Seton, MD  Prenatal Vit-Fe Fumarate-FA (PRENATAL COMPLETE) 14-0.4 MG TABS Take 1 tablet by mouth daily. Patient not taking: Reported on 02/19/2023 08/28/17   Eyvonne Mechanic, PA-C    Family History Family History  Problem Relation Age of Onset   ADD / ADHD Neg Hx    Alcohol abuse Neg Hx    Anxiety disorder Neg Hx    Arthritis Neg Hx    Asthma Neg Hx     Social History Social History   Tobacco Use   Smoking status: Never   Smokeless tobacco: Never  Vaping Use   Vaping status: Never Used  Substance Use Topics   Alcohol use: Not Currently    Comment: ocassionally   Drug use: No     Allergies   Patient has no known allergies.  Review of Systems Review of Systems  Genitourinary:  Positive for frequency.     Physical Exam Triage Vital Signs ED Triage Vitals  Encounter Vitals Group     BP 02/19/23 0924 130/86     Systolic BP Percentile --      Diastolic BP Percentile --      Pulse Rate 02/19/23 0924 84     Resp 02/19/23 0924 16     Temp 02/19/23 0924 98.6 F (37 C)     Temp Source 02/19/23 0924 Oral     SpO2 02/19/23 0924 98 %     Weight --      Height --      Head Circumference --      Peak Flow --      Pain Score 02/19/23 0923 8     Pain Loc --      Pain Education --      Exclude from Growth Chart --    No data found.  Updated Vital Signs BP 130/86 (BP Location: Right Arm)   Pulse 84   Temp 98.6 F (37 C) (Oral)   Resp 16   LMP 02/06/2023 (Approximate)   SpO2 98%   Breastfeeding No   Visual Acuity Right Eye Distance:   Left Eye Distance:   Bilateral Distance:    Right Eye Near:   Left Eye Near:    Bilateral Near:     Physical Exam Constitutional:      Appearance: Normal  appearance.  Cardiovascular:     Rate and Rhythm: Normal rate and regular rhythm.     Pulses: Normal pulses.     Heart sounds: Normal heart sounds.  Neurological:     Mental Status: She is alert.      UC Treatments / Results  Labs (all labs ordered are listed, but only abnormal results are displayed) Labs Reviewed  POCT URINALYSIS DIP (MANUAL ENTRY) - Abnormal; Notable for the following components:      Result Value   Blood, UA trace-intact (*)    Leukocytes, UA Trace (*)    All other components within normal limits    EKG   Radiology No results found.  Procedures Procedures (including critical care time)  Medications Ordered in UC Medications  ketorolac (TORADOL) 30 MG/ML injection 30 mg (30 mg Intramuscular Given 02/19/23 1008)    Initial Impression / Assessment and Plan / UC Course  I have reviewed the triage vital signs and the nursing notes.  Pertinent labs & imaging results that were available during my care of the patient were reviewed by me and considered in my medical decision making (see chart for details).     Discussed with patient that patient's symptoms appear to be more related to a possible kidney stone rather than an infection.  Patient's point-of-care urinalysis was unremarkable.  Patient does not have insurance at this time so does not have a primary care doctor to which she can go and order a CT scan.  In the meantime, will give patient a Toradol shot as well as get a KUB.  KUB shows no signs of any stones, patient was advised to take Tylenol and ibuprofen as needed for the pain.  Patient was advised to try and get her insurance sorted out so that she can establish with a PCP and get a CT scan for further diagnostic value. Final Clinical Impressions(s) / UC Diagnoses   Final diagnoses:  Flank pain     Discharge Instructions  Please take ibuprofen and Tylenol as needed for your pain  If you feel like your pain is getting worse despite  using these medications, please go to the ER.  It is advised that you get your insurance as soon as possible so that you may see your primary care doctor as you would likely need a CT scan to see if you truly have a kidney stone or not.     ED Prescriptions   None    PDMP not reviewed this encounter.   Brenton Grills, MD 02/19/23 1016

## 2023-02-19 NOTE — Discharge Instructions (Addendum)
Please take ibuprofen and Tylenol as needed for your pain  If you feel like your pain is getting worse despite using these medications, please go to the ER.  It is advised that you get your insurance as soon as possible so that you may see your primary care doctor as you would likely need a CT scan to see if you truly have a kidney stone or not.

## 2023-03-11 ENCOUNTER — Emergency Department (HOSPITAL_COMMUNITY)
Admission: EM | Admit: 2023-03-11 | Discharge: 2023-03-11 | Disposition: A | Discharge status: Home / Self Care | Payer: Self-pay | Attending: Emergency Medicine | Admitting: Emergency Medicine

## 2023-03-11 ENCOUNTER — Emergency Department (HOSPITAL_COMMUNITY): Payer: Self-pay

## 2023-03-11 ENCOUNTER — Encounter (HOSPITAL_COMMUNITY): Payer: Self-pay

## 2023-03-11 DIAGNOSIS — R339 Retention of urine, unspecified: Secondary | ICD-10-CM | POA: Insufficient documentation

## 2023-03-11 DIAGNOSIS — N3001 Acute cystitis with hematuria: Secondary | ICD-10-CM | POA: Insufficient documentation

## 2023-03-11 LAB — CBC
HCT: 38.4 % (ref 36.0–46.0)
Hemoglobin: 12.3 g/dL (ref 12.0–15.0)
MCH: 25.1 pg — ABNORMAL LOW (ref 26.0–34.0)
MCHC: 32 g/dL (ref 30.0–36.0)
MCV: 78.4 fL — ABNORMAL LOW (ref 80.0–100.0)
Platelets: 204 10*3/uL (ref 150–400)
RBC: 4.9 MIL/uL (ref 3.87–5.11)
RDW: 17.6 % — ABNORMAL HIGH (ref 11.5–15.5)
WBC: 8.4 10*3/uL (ref 4.0–10.5)
nRBC: 0 % (ref 0.0–0.2)

## 2023-03-11 LAB — URINALYSIS, W/ REFLEX TO CULTURE (INFECTION SUSPECTED)
Bilirubin Urine: NEGATIVE
Glucose, UA: NEGATIVE mg/dL
Ketones, ur: NEGATIVE mg/dL
Nitrite: NEGATIVE
Protein, ur: NEGATIVE mg/dL
RBC / HPF: >50 RBC/hpf (ref 0–5)
Specific Gravity, Urine: 1.014 (ref 1.005–1.030)
pH: 7 (ref 5.0–8.0)

## 2023-03-11 LAB — BASIC METABOLIC PANEL
Anion gap: 9 (ref 5–15)
BUN: 11 mg/dL (ref 6–20)
CO2: 24 mmol/L (ref 22–32)
Calcium: 9.2 mg/dL (ref 8.9–10.3)
Chloride: 103 mmol/L (ref 98–111)
Creatinine, Ser: 0.68 mg/dL (ref 0.44–1.00)
GFR, Estimated: >60 mL/min (ref >60)
Glucose, Bld: 92 mg/dL (ref 70–99)
Potassium: 3.3 mmol/L — ABNORMAL LOW (ref 3.5–5.1)
Sodium: 136 mmol/L (ref 135–145)

## 2023-03-11 LAB — HCG, SERUM, QUALITATIVE: Preg, Serum: NEGATIVE

## 2023-03-11 MED ORDER — SODIUM CHLORIDE 0.9 % IV BOLUS
500.0000 mL | Freq: Once | INTRAVENOUS | Status: AC
  Administered 2023-03-11: 500 mL via INTRAVENOUS

## 2023-03-11 MED ORDER — SODIUM CHLORIDE 0.9 % IV SOLN
2.0000 g | Freq: Once | INTRAVENOUS | Status: AC
  Administered 2023-03-11: 2 g via INTRAVENOUS
  Filled 2023-03-11: qty 20

## 2023-03-11 MED ORDER — CEFADROXIL 500 MG PO CAPS: 500.0000 mg | ORAL_CAPSULE | Freq: Two times a day (BID) | ORAL | 0 refills | Status: AC

## 2023-03-11 NOTE — ED Provider Notes (Signed)
[place] [facility] AT [facility] Provider Note   CSN: [id number] Arrival date & time: 03/11/23  5462     History  Chief Complaint  Patient presents with   Dysuria   Hematuria   Urinary Retention    Beverly Chapman is a 32 y.o. female.  Patient with noncontributory past medical history presents today with complaints of dysuria, urinary retention. She states that she has had persistent hematuria for the past few months. States that in the past few days she has begun to have dysuria as well. She states that now she is unable to urinate and has not emptied her bladder since 10 PM last night. She denies fevers or chills. No nausea, vomiting, or diarrhea. Does note that she currently has abdominal pain which she suspects is due to retention. No history of similar. No prior abdominal surgeries.   The history is provided by the patient. No language interpreter was used.  Dysuria Hematuria       Home Medications Prior to Admission medications   Medication Sig Start Date End Date Taking? Authorizing Provider  acetaminophen (TYLENOL) 500 MG tablet Take 2 tablets (1,000 mg total) by mouth every 6 (six) hours as needed. 10/06/20   Alric Seton, MD  coconut oil OIL Apply 1 application topically as needed. 10/06/20   Alric Seton, MD  ibuprofen (ADVIL) 600 MG tablet Take 1 tablet (600 mg total) by mouth every 8 (eight) hours. 10/06/20   Alric Seton, MD  Prenatal Vit-Fe Fumarate-FA (PRENATAL COMPLETE) 14-0.4 MG TABS Take 1 tablet by mouth daily. Patient not taking: Reported on 02/19/2023 08/28/17   Eyvonne Mechanic, PA-C      Allergies    Patient has no known allergies.    Review of Systems   Review of Systems  Genitourinary:  Positive for dysuria and hematuria.  All other systems reviewed and are negative.   Physical Exam Updated Vital Signs BP 116/81 (BP Location: Right Arm)   Pulse 62   Temp 98.7 F (37.1 C) (Oral)   Resp 18   Ht 5'  3" (1.6 m)   Wt 81.6 kg   LMP 02/06/2023 (Approximate)   SpO2 100%   BMI 31.89 kg/m  Physical Exam Vitals and nursing note reviewed.  Constitutional:      General: She is not in acute distress.    Appearance: Normal appearance. She is normal weight. She is not ill-appearing, toxic-appearing or diaphoretic.  HENT:     Head: Normocephalic and atraumatic.  Cardiovascular:     Rate and Rhythm: Normal rate.  Pulmonary:     Effort: Pulmonary effort is normal. No respiratory distress.  Abdominal:     General: There is distension.     Tenderness: There is abdominal tenderness.  Musculoskeletal:        General: Normal range of motion.     Cervical back: Normal range of motion.  Skin:    General: Skin is warm and dry.  Neurological:     General: No focal deficit present.     Mental Status: She is alert.  Psychiatric:        Mood and Affect: Mood normal.        Behavior: Behavior normal.     ED Results / Procedures / Treatments   Labs (all labs ordered are listed, but only abnormal results are displayed) Labs Reviewed  BASIC METABOLIC PANEL - Abnormal; Notable for the following components:      Result Value  Potassium 3.3 (*)    All other components within normal limits  CBC - Abnormal; Notable for the following components:   MCV 78.4 (*)    MCH 25.1 (*)    RDW 17.6 (*)    All other components within normal limits  HCG, SERUM, QUALITATIVE  URINALYSIS, W/ REFLEX TO CULTURE (INFECTION SUSPECTED)    EKG None  Radiology CT Renal Stone Study Result Date: 03/11/2023 CLINICAL DATA:  Abdominal/flank pain with stone suspected EXAM: CT ABDOMEN AND PELVIS WITHOUT CONTRAST TECHNIQUE: Multidetector CT imaging of the abdomen and pelvis was performed following the standard protocol without IV contrast. RADIATION DOSE REDUCTION: This exam was performed according to the departmental dose-optimization program which includes automated exposure control, adjustment of the mA and/or kV  according to patient size and/or use of iterative reconstruction technique. COMPARISON:  None Available. FINDINGS: Lower chest:  No contributory findings. Hepatobiliary: No focal liver abnormality.No evidence of biliary obstruction or stone. Pancreas: Unremarkable. Spleen: Unremarkable. Adrenals/Urinary Tract: Negative adrenals. No hydronephrosis or stone. 14 mm lesion exophytic from the upper pole left kidney, homogeneous and nearly 90 Hounsfield units, consistent with hemorrhagic cyst. No adjacent fat stranding to imply this is acute and the cause of symptoms . borderline bladder wall thickening and indistinctness. Stomach/Bowel:  No obstruction. No appendicitis. Vascular/Lymphatic: No acute vascular abnormality. No mass or adenopathy. Reproductive:No pathologic findings. Other: No ascites or pneumoperitoneum. Musculoskeletal: No acute abnormalities. IMPRESSION: 1. Possible cystitis. 2. No hydronephrosis or nephrolithiasis. 3. Hemorrhagic cyst appearance in the upper pole left kidney measuring 1.4 cm. Electronically Signed   By: Tiburcio Pea M.D.   On: 03/11/2023 11:34    Procedures Procedures    Medications Ordered in ED Medications  sodium chloride 0.9 % bolus 500 mL (500 mLs Intravenous New Bag/Given 03/11/23 1203)    ED Course/ Medical Decision Making/ A&P                                 Medical Decision Making Amount and/or Complexity of Data Reviewed Labs: ordered. Radiology: ordered.   This patient is a 32 y.o. female who presents to the ED for concern of dysuria, hematuria, urinary retention, this involves an extensive number of treatment options, and is a complaint that carries with it a high risk of complications and morbidity. The emergent differential diagnosis prior to evaluation includes, but is not limited to,  UTI, nephrolithiasis, neoplasm . This is not an exhaustive differential.   Past Medical History / Co-morbidities / Social History:  has a past medical history of  Preterm labor.  Additional history: Chart reviewed.  Physical Exam: Physical exam performed. The pertinent findings include: abdominal distention and pain  Lab Tests: I ordered, and personally interpreted labs.  The pertinent results include: No leukocytosis, K 3.3, UA infectious   Imaging Studies: I ordered imaging studies including Ct renal. I independently visualized and interpreted imaging which showed   1. Possible cystitis. 2. No hydronephrosis or nephrolithiasis. 3. Hemorrhagic cyst appearance in the upper pole left kidney measuring 1.4 cm.   I agree with the radiologist interpretation.   Medications: I ordered medication including fluids, rocephin  for UTI. Reevaluation of the patient after these medicines showed that the patient improved. I have reviewed the patients home medicines and have made adjustments as needed.   Disposition: After consideration of the diagnostic results and the patients response to treatment, I feel that emergency department workup does not suggest an  emergent condition requiring admission or immediate intervention beyond what has been performed at this time. The plan is: discharge with duracef for UTI with urology referral for follow-up. Patient initially presented for urinary retention, had 400 ml on bladder scan, patient with abdominal distention and pain, discussed inserting foley catheter which patient was amenable to. While nursing staff was obtaining supplies to perform this procedure and prior to patients CT, patient states she was attempting to find a position of comfort due to pain and pulled her legs to her chest and states that she suddenly began to urinate spontaneously. Post void residual 170 ml. Patient states her pain has resolved. Given UA and CT, treated for UTI with Rocephin. Patient monitored to ensure she continued to be able to urinate. Upon reassessment, patient continues to be pain free and does still have dysuria, however states she  has been able to void several times. Discussed some continued concern for retention given post void residual measuring. Unclear etiology, potentially due to blood clots from her hematuria, related to UTI, however will need urology follow-up to further evaluate. Patient would prefer to forgo foley placement at this time which is reasonable.  Evaluation and diagnostic testing in the emergency department does not suggest an emergent condition requiring admission or immediate intervention beyond what has been performed at this time.  Plan for discharge with close PCP follow-up.  Patient is understanding and amenable with plan, educated on red flag symptoms that would prompt immediate return.  Patient discharged in stable condition.  Findings and plan of care discussed with supervising physician Dr. Criss Alvine who is in agreement.   Final Clinical Impression(s) / ED Diagnoses Final diagnoses:  Acute cystitis with hematuria  Urinary retention with incomplete bladder emptying    Rx / DC Orders ED Discharge Orders          Ordered    cefadroxil (DURICEF) 500 MG capsule  2 times daily        03/11/23 1702          An After Visit Summary was printed and given to the patient.     Vear Clock 03/11/23 1703    Pricilla Loveless, MD 03/12/23 [phone]

## 2023-03-11 NOTE — ED Notes (Signed)
Beverly Chapman, [place] messaged regarding foley catheter placement. Maralyn Sago states to hold off for now

## 2023-03-11 NOTE — Discharge Instructions (Signed)
As we discussed, your workup in the ER today revealed that you have a urinary tract infection.  This could be the cause of your symptoms.  We have given you the first dose of antibiotics today and I have given you a prescription for additional antibiotics that you need to fill and take as prescribed in its entirety.  Initially, given that it seems you are having difficulty completely emptying your bladder along with several months of blood in your urine, I recommend that you see a urologist.  I have given you a referral with a number to call to schedule an appointment.  Please do so at your earliest convenience.  Return if development of any new or worsening symptoms.

## 2023-03-11 NOTE — ED Triage Notes (Signed)
PT arrives via POV. Pt reports she has not urinated since yesterday around 2000. States when she was able to urinate, it was painful and she noticed some blood in her urine. Pt AxOx4.

## 2023-07-19 ENCOUNTER — Ambulatory Visit: Payer: Self-pay | Attending: Urology | Admitting: Physical Therapy

## 2023-11-05 ENCOUNTER — Telehealth: Payer: Self-pay
